# Patient Record
Sex: Female | Born: 1935 | Race: White | Hispanic: No | Smoking: Never smoker
Health system: Southern US, Community
[De-identification: ages and names within clinical notes are randomized; demographics above are authoritative.]

## PROBLEM LIST (undated history)

## (undated) DIAGNOSIS — H409 Unspecified glaucoma: Secondary | ICD-10-CM

## (undated) DIAGNOSIS — I10 Essential (primary) hypertension: Secondary | ICD-10-CM

## (undated) DIAGNOSIS — D649 Anemia, unspecified: Secondary | ICD-10-CM

## (undated) DIAGNOSIS — I471 Supraventricular tachycardia, unspecified: Secondary | ICD-10-CM

## (undated) DIAGNOSIS — Z9889 Other specified postprocedural states: Secondary | ICD-10-CM

## (undated) DIAGNOSIS — M1990 Unspecified osteoarthritis, unspecified site: Secondary | ICD-10-CM

## (undated) DIAGNOSIS — K219 Gastro-esophageal reflux disease without esophagitis: Secondary | ICD-10-CM

## (undated) DIAGNOSIS — R112 Nausea with vomiting, unspecified: Secondary | ICD-10-CM

## (undated) DIAGNOSIS — R32 Unspecified urinary incontinence: Secondary | ICD-10-CM

## (undated) HISTORY — DX: Unspecified glaucoma: H40.9

## (undated) HISTORY — PX: JOINT REPLACEMENT: SHX530

## (undated) HISTORY — DX: Supraventricular tachycardia: I47.1

## (undated) HISTORY — DX: Essential (primary) hypertension: I10

## (undated) HISTORY — DX: Supraventricular tachycardia, unspecified: I47.10

## (undated) HISTORY — PX: BREAST BIOPSY: SHX20

## (undated) HISTORY — PX: LAPAROSCOPIC CHOLECYSTECTOMY: SUR755

## (undated) HISTORY — PX: TOTAL KNEE ARTHROPLASTY: SHX125

---

## 1996-10-08 HISTORY — PX: INGUINAL HERNIA REPAIR: SUR1180

## 2006-06-24 ENCOUNTER — Emergency Department (HOSPITAL_COMMUNITY): Admission: EM | Admit: 2006-06-24 | Discharge: 2006-06-24 | Payer: Self-pay | Admitting: *Deleted

## 2006-07-09 ENCOUNTER — Encounter (HOSPITAL_COMMUNITY): Admission: RE | Admit: 2006-07-09 | Discharge: 2006-08-08 | Payer: Self-pay | Admitting: Preventative Medicine

## 2009-02-04 ENCOUNTER — Ambulatory Visit: Payer: Self-pay | Admitting: Cardiology

## 2009-03-09 ENCOUNTER — Ambulatory Visit (HOSPITAL_COMMUNITY): Admission: RE | Admit: 2009-03-09 | Discharge: 2009-03-09 | Payer: Self-pay | Admitting: Pulmonary Disease

## 2009-03-16 ENCOUNTER — Inpatient Hospital Stay (HOSPITAL_COMMUNITY): Admission: RE | Admit: 2009-03-16 | Discharge: 2009-03-20 | Payer: Self-pay | Admitting: Orthopedic Surgery

## 2009-03-25 DIAGNOSIS — E785 Hyperlipidemia, unspecified: Secondary | ICD-10-CM | POA: Insufficient documentation

## 2009-03-25 DIAGNOSIS — I10 Essential (primary) hypertension: Secondary | ICD-10-CM | POA: Insufficient documentation

## 2009-07-28 ENCOUNTER — Ambulatory Visit (HOSPITAL_COMMUNITY): Admission: RE | Admit: 2009-07-28 | Discharge: 2009-07-28 | Payer: Self-pay | Admitting: Ophthalmology

## 2009-08-18 ENCOUNTER — Ambulatory Visit (HOSPITAL_COMMUNITY): Admission: RE | Admit: 2009-08-18 | Discharge: 2009-08-18 | Payer: Self-pay | Admitting: Ophthalmology

## 2010-05-16 IMAGING — CR DG CHEST 2V
2 series · 2 of 2 positions shown · non-contrast
Comparison: None

CLINICAL DATA: History of hypertension.  Preoperative
cardiopulmonary evaluation.

CHEST - 2 VIEW

[view not recorded (1 of 2)]
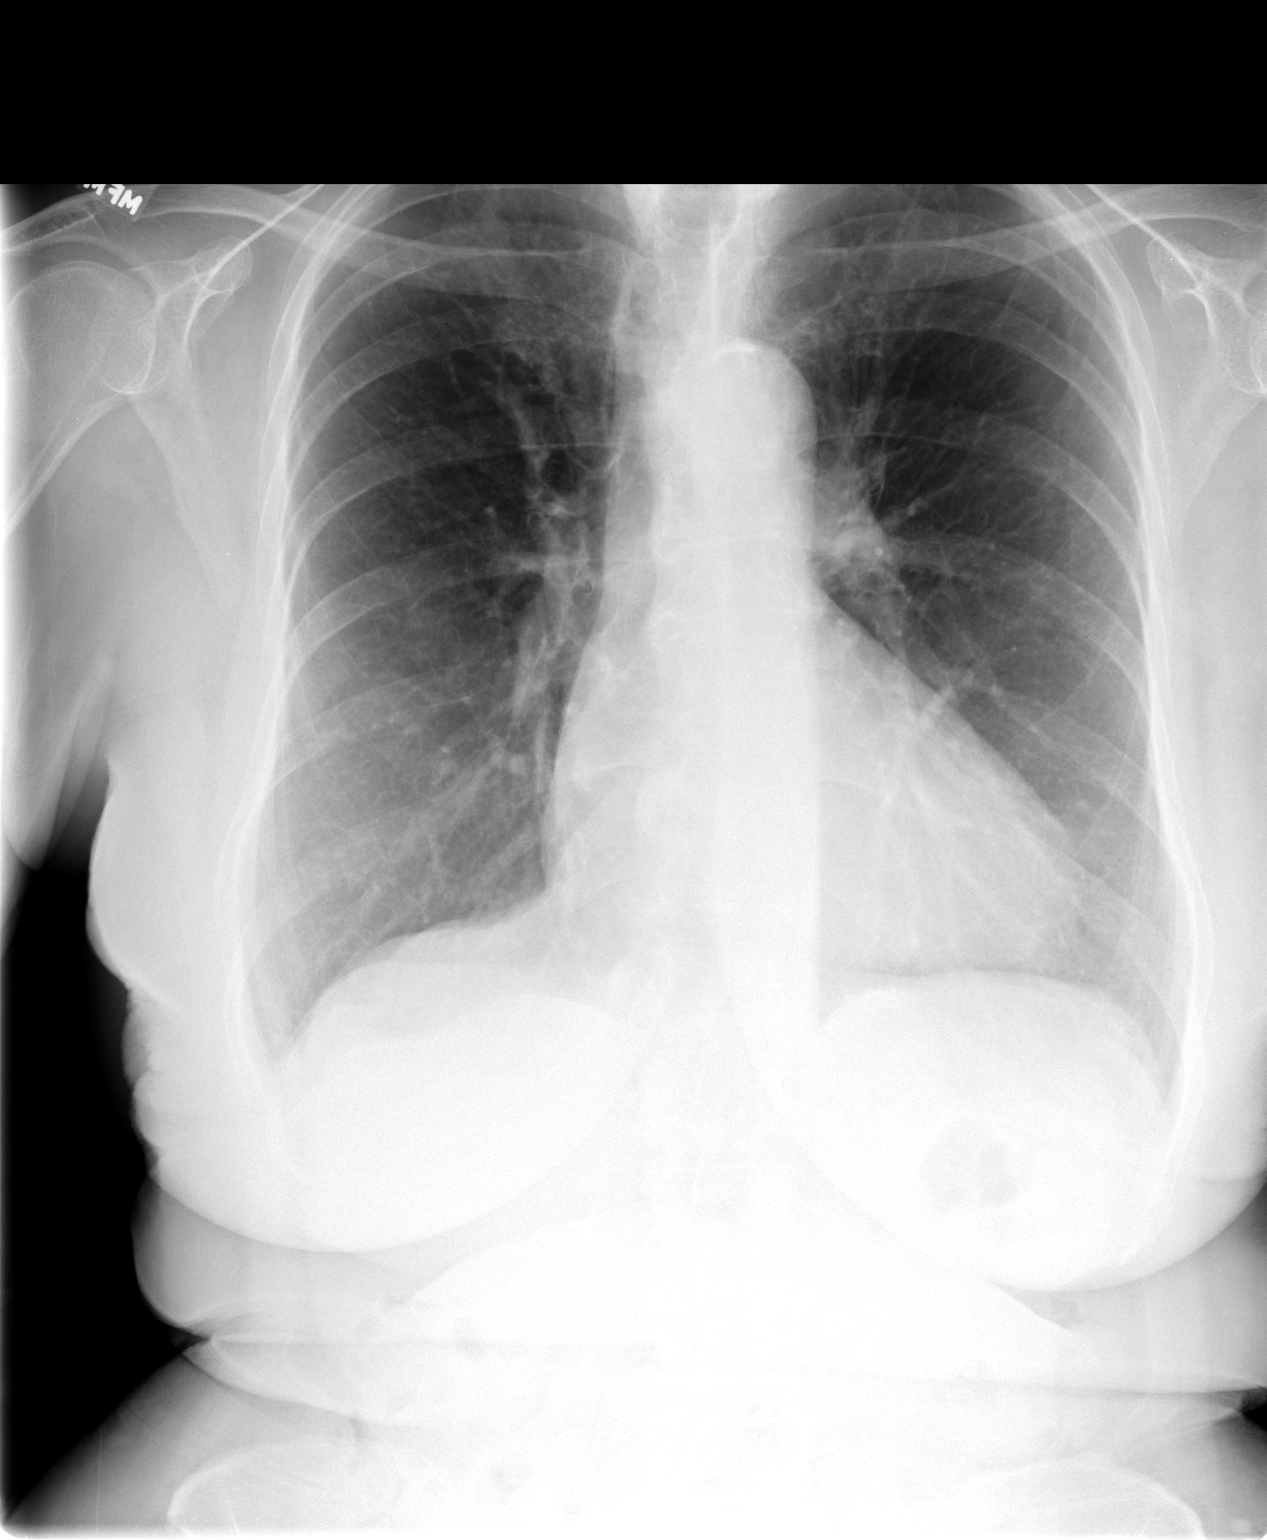

[view not recorded (2 of 2)]
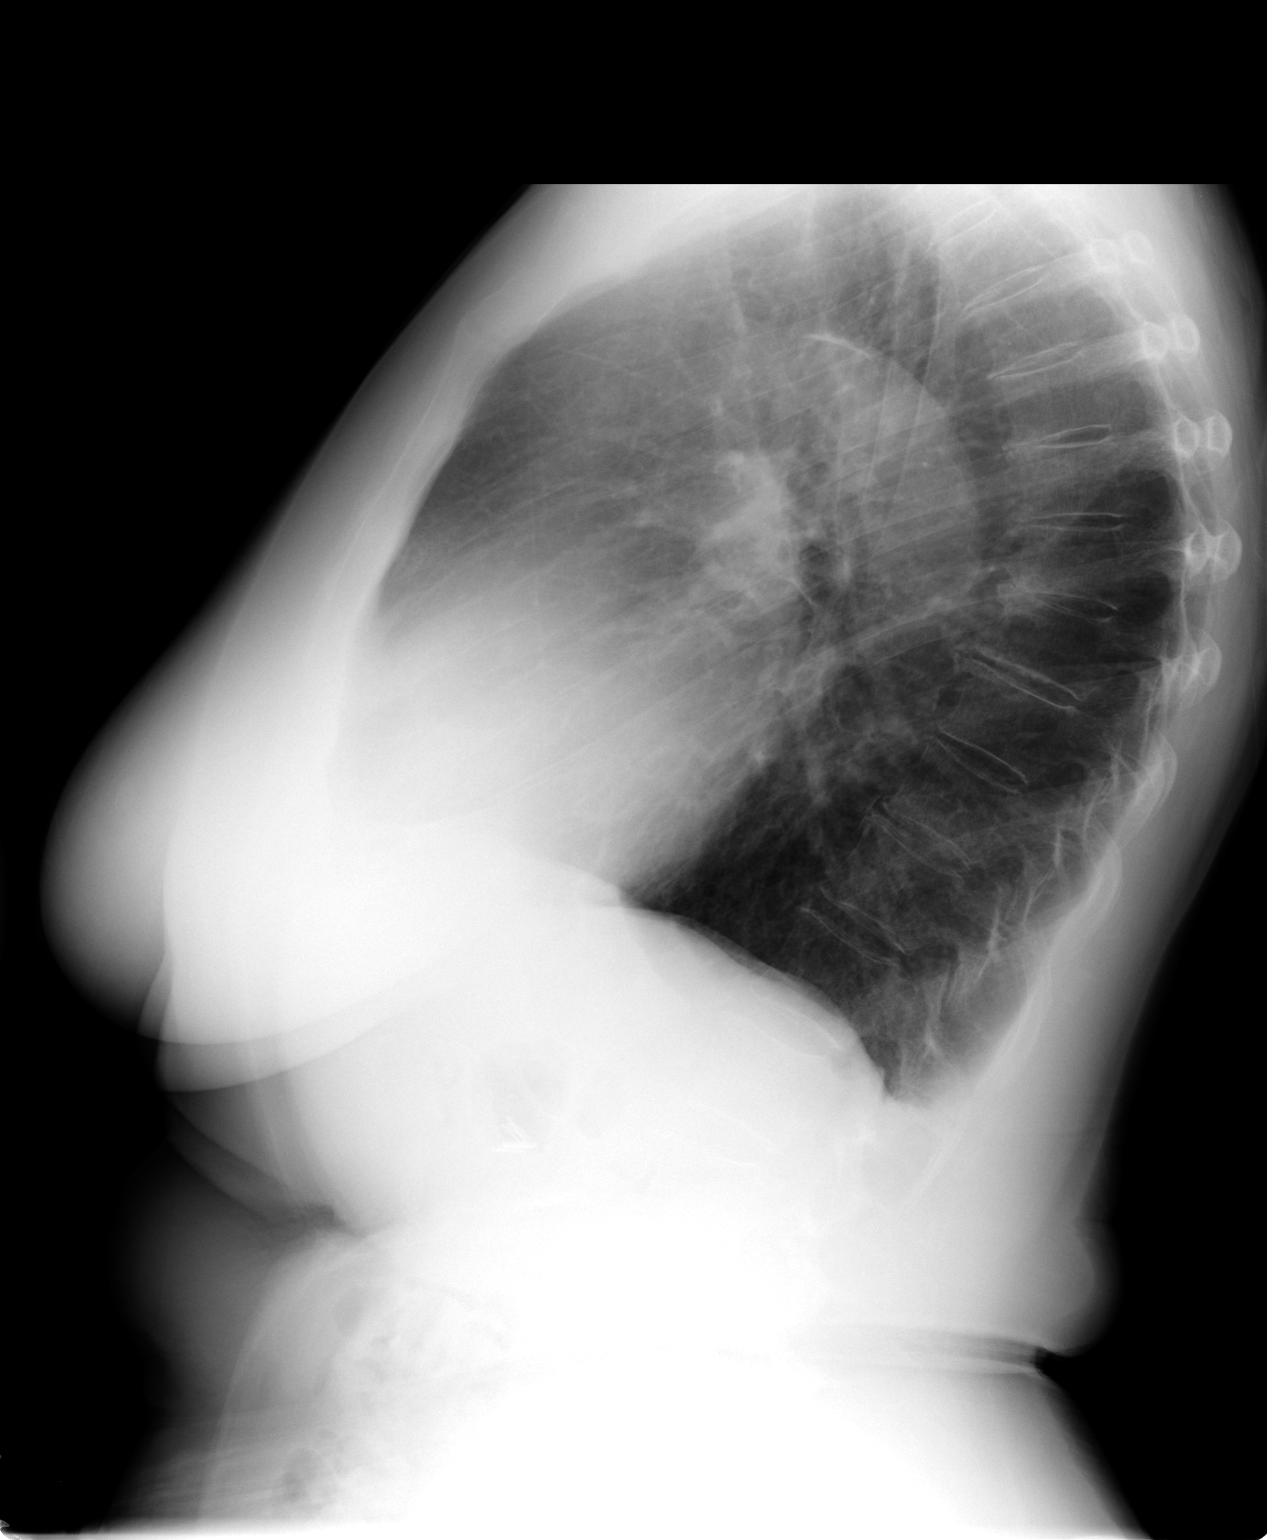

[2 of 2 positions shown; findings below may reference images not displayed]

FINDINGS: The cardiac silhouette is minimally enlarged. Ectasia and
nonaneurysmal calcification of the thoracic aorta is seen. The
lungs are well aerated and free of infiltrates. There is slight
flattening of the diaphragm on lateral image which may reflect
minimal hyperinflation configuration.  There is slight blunting of
posterior costophrenic angles most likely reflecting chronic
pleural thickening. There is a mildly osteopenic appearance of the
bones. There is mild degenerative spondylosis compatible with age.
IMPRESSION: Slight flattening of diaphragm on lateral image may reflect minimal
hyperinflation.  No acute cardiopulmonary process is identified.

## 2010-06-20 ENCOUNTER — Ambulatory Visit: Payer: Self-pay | Admitting: Cardiology

## 2010-06-20 ENCOUNTER — Encounter: Payer: Self-pay | Admitting: Adult Health

## 2010-06-21 ENCOUNTER — Encounter: Payer: Self-pay | Admitting: Cardiology

## 2010-06-26 ENCOUNTER — Ambulatory Visit (HOSPITAL_COMMUNITY): Admission: RE | Admit: 2010-06-26 | Discharge: 2010-06-26 | Payer: Self-pay | Admitting: Orthopedic Surgery

## 2010-06-29 ENCOUNTER — Ambulatory Visit (HOSPITAL_COMMUNITY): Admission: RE | Admit: 2010-06-29 | Discharge: 2010-06-29 | Payer: Self-pay | Admitting: Cardiology

## 2010-06-29 ENCOUNTER — Ambulatory Visit: Payer: Self-pay | Admitting: Cardiology

## 2010-06-29 ENCOUNTER — Encounter: Payer: Self-pay | Admitting: Cardiology

## 2010-06-30 ENCOUNTER — Encounter: Payer: Self-pay | Admitting: Adult Health

## 2010-07-02 ENCOUNTER — Emergency Department (HOSPITAL_COMMUNITY): Admission: EM | Admit: 2010-07-02 | Discharge: 2010-07-02 | Payer: Self-pay | Admitting: Emergency Medicine

## 2010-07-28 ENCOUNTER — Inpatient Hospital Stay (HOSPITAL_COMMUNITY): Admission: RE | Admit: 2010-07-28 | Discharge: 2010-07-31 | Payer: Self-pay | Admitting: Orthopedic Surgery

## 2010-11-07 NOTE — Miscellaneous (Signed)
Summary: echo  Clinical Lists Changes  Observations: Added new observation of ECHOINTERP:   Study Conclusions    - Left ventricle: The cavity size was normal. Wall thickness was     normal. Systolic function was normal. The estimated ejection     fraction was in the range of 55% to 60%. Wall motion was normal;     there were no regional wall motion abnormalities. Doppler     parameters are consistent with abnormal left ventricular     relaxation (grade 1 diastolic dysfunction).   - Mitral valve: Mild to possibly moderate regurgitation.   - Left atrium: The atrium was moderately dilated.   - Right atrium: The atrium was mildly dilated.   - Tricuspid valve: Trivial regurgitation.   - Pericardium, extracardiac: There was no pericardial effusion. Prepared and Electronically Authenticated by    Nona Dell, MD   2011-09-22T17:23:05.790  (06/29/2010 13:50)      Echocardiogram  Procedure date:  06/29/2010  Findings:        Study Conclusions    - Left ventricle: The cavity size was normal. Wall thickness was     normal. Systolic function was normal. The estimated ejection     fraction was in the range of 55% to 60%. Wall motion was normal;     there were no regional wall motion abnormalities. Doppler     parameters are consistent with abnormal left ventricular     relaxation (grade 1 diastolic dysfunction).   - Mitral valve: Mild to possibly moderate regurgitation.   - Left atrium: The atrium was moderately dilated.   - Right atrium: The atrium was mildly dilated.   - Tricuspid valve: Trivial regurgitation.   - Pericardium, extracardiac: There was no pericardial effusion. Prepared and Electronically Authenticated by    Nona Dell, MD   2011-09-22T17:23:05.790

## 2010-11-07 NOTE — Assessment & Plan Note (Signed)
Summary: ROV SURGICAL CLEARENCE KNEE SURGERY   History of Present Illness: Kathy Ford is a very pleasant 77 y/o CF who is here for pre-operative evaluation for L knee replacement. She has a history of hypertension, hypercholesterolemia.   She has been seen by Dr. Dietrich Pates over 2 years ago with complaints of palpations found to be elicited by caffine use. She has had no futher complaints of this since decreasing caffine intake.  She has been feeling overall weakness and muscle heaviness in arms and legs for the last month.  She was taken off of crestor by Dr. Juanetta Gosling, believing this to be related to statin induced myopathy.  Since stopping the crestor the symptoms have improved but she still has episodes of unexplained overall weakness at times.  She denies chest pain, DOE, syncope or diaphoresis.  Current Medications (verified): 1)  Klor-Con M20 20 Meq Cr-Tabs (Potassium Chloride Crys Cr) .Marland Kitchen.. 1` By Mouth Three Times A Day For Leg Cramps` 2)  Aspirin 81 Mg  Tabs (Aspirin) .Marland Kitchen.. 1 By Mouth Daily 3)  Lisinopril-Hydrochlorothiazide 10-12.5 Mg Tabs (Lisinopril-Hydrochlorothiazide) .Marland Kitchen.. 1 By Mouth Daily 4)  Bisoprolol-Hydrochlorothiazide 10-6.25 Mg Tabs (Bisoprolol-Hydrochlorothiazide) .Marland Kitchen.. 1 By Mouth Daily 5)  Alphagan P 0.15 % Soln (Brimonidine Tartrate) .... As Directed 6)  Xalatan 0.005 % Soln (Latanoprost) .... As Directed 7)  Multivitamins   Tabs (Multiple Vitamin) .Marland Kitchen.. 1 By Mouth Daily  Allergies (verified): 1)  ! Asa 2)  ! * "caine" Class  Past History:  Past medical, surgical, family and social histories (including risk factors) reviewed, and no changes noted (except as noted below).  Past Medical History: Reviewed history from 03/25/2009 and no changes required. HYPERTENSION, UNSPECIFIED (ICD-401.9) HYPERLIPIDEMIA-MIXED (ICD-272.4)    Past Surgical History: Reviewed history from 03/25/2009 and no changes required. Breast biopsies x2.  Laparoscopic gallbladder surgery, 1993.    Inguinal hernia repair, 1998.  Family History: Reviewed history from 03/25/2009 and no changes required. Family History of Coronary Artery Disease:  COPD Family History of CVA or Stroke:   Social History: Reviewed history from 03/25/2009 and no changes required. Retired - [employer] Married  Tobacco Use - No.  Alcohol Use - no Regular Exercise - yes Drug Use - no  Review of Systems       Gerneralized fatigue and L knee pain.  Vital Signs:  Patient profile:   77 year old female Height:      62 inches Weight:      147 pounds BMI:     26.98 Pulse rate:   63 / minute Resp:     16 per minute BP sitting:   154 / 97  (left arm)  Vitals Entered By: Marrion Coy, CNA (June 20, 2010 2:04 PM)  Physical Exam  General:  Well developed, well nourished, in no acute distress. Head:  normocephalic and atraumatic Eyes:  PERRLA/EOM intact; conjunctiva and lids normal. Lungs:  Clear bilaterally to auscultation and percussion. Heart:  Non-displaced PMI, chest non-tender; regular rate and rhythm, S1, S2 without murmurs, rubs or gallops. Carotid upstroke normal, no bruit. Normal abdominal aortic size, no bruits. Femorals normal pulses, no bruits. Pedals normal pulses. No edema, no varicosities. Msk:  joint tenderness.  Right knee. Pulses:  pulses normal in all 4 extremities Extremities:  No clubbing or cyanosis. Neurologic:  Alert and oriented x 3. Psych:  Normal affect.   EKG  Procedure date:  06/20/2010  Findings:      Normal sinus rhythm with rate of:63bpm  Sinus bradycardia  Impression & Recommendations:  Problem # 1:  PRE-OPERATIVE CARDIOVASCULAR EXAMINATION (ICD-V72.81) We will plan echocardiogram for LV function to assist with fluid status perioperatively.  Overall she is a low risk for knee surgery with 2 risk factors-HTN and Hypercholesterolemia.  She has also been seen and examined by Dr. Daleen Squibb who agrees with plan.  Once echo completed and  results read we will be able to make recommendations for clearance Her updated medication list for this problem includes:    Aspirin 81 Mg Tabs (Aspirin) .Marland Kitchen... 1 by mouth daily    Lisinopril-hydrochlorothiazide 10-12.5 Mg Tabs (Lisinopril-hydrochlorothiazide) .Marland Kitchen... 1 by mouth daily    Bisoprolol-hydrochlorothiazide 10-6.25 Mg Tabs (Bisoprolol-hydrochlorothiazide) .Marland Kitchen... 1 by mouth daily  Problem # 2:  HYPERTENSION, UNSPECIFIED (ICD-401.9) Not well controlled at this time.  As this is the only BP we have seen, it may be white coat.  However she is on two medications for control.  One medication, the bisprolol may be partial cause for fatigue.  Can consider just increasing the ACE inhibitor to 20 mg daily and discontinue BB to see if symptoms of fatigue improve. Will discuss at later date on follow-up. Her updated medication list for this problem includes:    Aspirin 81 Mg Tabs (Aspirin) .Marland Kitchen... 1 by mouth daily    Lisinopril-hydrochlorothiazide 10-12.5 Mg Tabs (Lisinopril-hydrochlorothiazide) .Marland Kitchen... 1 by mouth daily    Bisoprolol-hydrochlorothiazide 10-6.25 Mg Tabs (Bisoprolol-hydrochlorothiazide) .Marland Kitchen... 1 by mouth daily  Orders: 2-D Echocardiogram (2D Echo)  Patient Instructions: 1)  Your physician recommends that you schedule a follow-up appointment in: as needed 2)  Your physician has requested that you have an echocardiogram.  Echocardiography is a painless test that uses sound waves to create images of your heart. It provides your doctor with information about the size and shape of your heart and how well your heart's chambers and valves are working.  This procedure takes approximately one hour. There are no restrictions for this procedure.

## 2010-12-20 LAB — BASIC METABOLIC PANEL
BUN: 22 mg/dL (ref 6–23)
CO2: 29 mEq/L (ref 19–32)
CO2: 30 mEq/L (ref 19–32)
Chloride: 100 mEq/L (ref 96–112)
Chloride: 106 mEq/L (ref 96–112)
GFR calc Af Amer: >60 mL/min (ref >60)
GFR calc Af Amer: >60 mL/min (ref >60)
Potassium: 4 mEq/L (ref 3.5–5.1)
Sodium: 136 mEq/L (ref 135–145)

## 2010-12-20 LAB — CBC
HCT: 25 % — ABNORMAL LOW (ref 36.0–46.0)
HCT: 26.4 % — ABNORMAL LOW (ref 36.0–46.0)
Hemoglobin: 8.6 g/dL — ABNORMAL LOW (ref 12.0–15.0)
Hemoglobin: 8.6 g/dL — ABNORMAL LOW (ref 12.0–15.0)
MCH: 33.5 pg (ref 26.0–34.0)
MCH: 34 pg (ref 26.0–34.0)
MCV: 97.2 fL (ref 78.0–100.0)
MCV: 97.7 fL (ref 78.0–100.0)
MCV: 97.7 fL (ref 78.0–100.0)
RBC: 2.52 MIL/uL — ABNORMAL LOW (ref 3.87–5.11)
RBC: 2.56 MIL/uL — ABNORMAL LOW (ref 3.87–5.11)
RBC: 2.7 MIL/uL — ABNORMAL LOW (ref 3.87–5.11)
WBC: 6.2 10*3/uL (ref 4.0–10.5)

## 2010-12-20 LAB — TYPE AND SCREEN

## 2010-12-20 LAB — PROTIME-INR: Prothrombin Time: 16.7 seconds — ABNORMAL HIGH (ref 11.6–15.2)

## 2010-12-21 LAB — URINALYSIS, ROUTINE W REFLEX MICROSCOPIC
Bilirubin Urine: NEGATIVE
Bilirubin Urine: NEGATIVE
Glucose, UA: NEGATIVE mg/dL
Glucose, UA: NEGATIVE mg/dL
Hgb urine dipstick: NEGATIVE
Ketones, ur: NEGATIVE mg/dL
Nitrite: NEGATIVE
Specific Gravity, Urine: 1.007 (ref 1.005–1.030)
Specific Gravity, Urine: 1.02 (ref 1.005–1.030)
Urobilinogen, UA: 0.2 mg/dL (ref 0.0–1.0)
Urobilinogen, UA: 0.2 mg/dL (ref 0.0–1.0)
pH: 6.5 (ref 5.0–8.0)

## 2010-12-21 LAB — COMPREHENSIVE METABOLIC PANEL
AST: 20 U/L (ref 0–37)
AST: 22 U/L (ref 0–37)
Albumin: 3.8 g/dL (ref 3.5–5.2)
BUN: 16 mg/dL (ref 6–23)
CO2: 28 mEq/L (ref 19–32)
CO2: 30 mEq/L (ref 19–32)
Calcium: 9.4 mg/dL (ref 8.4–10.5)
Chloride: 104 mEq/L (ref 96–112)
Creatinine, Ser: 0.58 mg/dL (ref 0.4–1.2)
Creatinine, Ser: 0.71 mg/dL (ref 0.4–1.2)
GFR calc Af Amer: >60 mL/min (ref >60)
GFR calc Af Amer: >60 mL/min (ref >60)
GFR calc non Af Amer: >60 mL/min (ref >60)
GFR calc non Af Amer: >60 mL/min (ref >60)
Total Bilirubin: 0.4 mg/dL (ref 0.3–1.2)

## 2010-12-21 LAB — DIFFERENTIAL
Basophils Relative: 1 % (ref 0–1)
Eosinophils Absolute: 0.1 10*3/uL (ref 0.0–0.7)
Monocytes Absolute: 0.4 10*3/uL (ref 0.1–1.0)
Monocytes Relative: 7 % (ref 3–12)
Neutro Abs: 3.9 10*3/uL (ref 1.7–7.7)

## 2010-12-21 LAB — CBC
HCT: 33.2 % — ABNORMAL LOW (ref 36.0–46.0)
Hemoglobin: 11.3 g/dL — ABNORMAL LOW (ref 12.0–15.0)
Hemoglobin: 11.7 g/dL — ABNORMAL LOW (ref 12.0–15.0)
MCH: 33.4 pg (ref 26.0–34.0)
MCH: 33.5 pg (ref 26.0–34.0)
MCH: 33.8 pg (ref 26.0–34.0)
MCHC: 34.1 g/dL (ref 30.0–36.0)
MCHC: 34.5 g/dL (ref 30.0–36.0)
MCV: 97.8 fL (ref 78.0–100.0)
MCV: 98.3 fL (ref 78.0–100.0)
Platelets: 234 10*3/uL (ref 150–400)
RBC: 3.5 MIL/uL — ABNORMAL LOW (ref 3.87–5.11)

## 2010-12-21 LAB — SURGICAL PCR SCREEN
MRSA, PCR: NEGATIVE
Staphylococcus aureus: NEGATIVE

## 2010-12-21 LAB — BASIC METABOLIC PANEL
CO2: 27 mEq/L (ref 19–32)
Glucose, Bld: 104 mg/dL — ABNORMAL HIGH (ref 70–99)
Potassium: 3.1 mEq/L — ABNORMAL LOW (ref 3.5–5.1)
Sodium: 138 mEq/L (ref 135–145)

## 2010-12-21 LAB — URINE MICROSCOPIC-ADD ON

## 2010-12-21 LAB — APTT: aPTT: 26 seconds (ref 24–37)

## 2011-01-11 LAB — BASIC METABOLIC PANEL
CO2: 31 mEq/L (ref 19–32)
Calcium: 9.3 mg/dL (ref 8.4–10.5)
GFR calc Af Amer: >60 mL/min (ref >60)
GFR calc non Af Amer: >60 mL/min (ref >60)
Sodium: 140 mEq/L (ref 135–145)

## 2011-01-11 LAB — HEMOGLOBIN AND HEMATOCRIT, BLOOD
HCT: 32.8 % — ABNORMAL LOW (ref 36.0–46.0)
Hemoglobin: 11.4 g/dL — ABNORMAL LOW (ref 12.0–15.0)

## 2011-01-15 LAB — URINALYSIS, ROUTINE W REFLEX MICROSCOPIC
Bilirubin Urine: NEGATIVE
Nitrite: NEGATIVE
Specific Gravity, Urine: 1.005 (ref 1.005–1.030)
Urobilinogen, UA: 0.2 mg/dL (ref 0.0–1.0)
pH: 6 (ref 5.0–8.0)

## 2011-01-15 LAB — CBC
HCT: 27.7 % — ABNORMAL LOW (ref 36.0–46.0)
Hemoglobin: 7.4 g/dL — CL (ref 12.0–15.0)
MCHC: 34.3 g/dL (ref 30.0–36.0)
MCHC: 34.5 g/dL (ref 30.0–36.0)
MCV: 95.3 fL (ref 78.0–100.0)
MCV: 96.2 fL (ref 78.0–100.0)
Platelets: 144 10*3/uL — ABNORMAL LOW (ref 150–400)
Platelets: 187 10*3/uL (ref 150–400)
Platelets: 262 10*3/uL (ref 150–400)
RBC: 3.21 MIL/uL — ABNORMAL LOW (ref 3.87–5.11)
RDW: 12.6 % (ref 11.5–15.5)
RDW: 12.7 % (ref 11.5–15.5)
RDW: 13 % (ref 11.5–15.5)
WBC: 7.2 10*3/uL (ref 4.0–10.5)
WBC: 9.8 10*3/uL (ref 4.0–10.5)

## 2011-01-15 LAB — ABO/RH: ABO/RH(D): O POS

## 2011-01-15 LAB — BASIC METABOLIC PANEL
BUN: 15 mg/dL (ref 6–23)
BUN: 9 mg/dL (ref 6–23)
CO2: 30 mEq/L (ref 19–32)
CO2: 31 mEq/L (ref 19–32)
Calcium: 8.3 mg/dL — ABNORMAL LOW (ref 8.4–10.5)
Chloride: 101 mEq/L (ref 96–112)
Chloride: 98 mEq/L (ref 96–112)
Creatinine, Ser: 0.45 mg/dL (ref 0.4–1.2)
Creatinine, Ser: 0.61 mg/dL (ref 0.4–1.2)
Creatinine, Ser: 0.64 mg/dL (ref 0.4–1.2)
GFR calc Af Amer: >60 mL/min (ref >60)
GFR calc Af Amer: >60 mL/min (ref >60)
GFR calc non Af Amer: >60 mL/min (ref >60)
GFR calc non Af Amer: >60 mL/min (ref >60)
Glucose, Bld: 111 mg/dL — ABNORMAL HIGH (ref 70–99)
Glucose, Bld: 112 mg/dL — ABNORMAL HIGH (ref 70–99)
Glucose, Bld: 118 mg/dL — ABNORMAL HIGH (ref 70–99)
Glucose, Bld: 183 mg/dL — ABNORMAL HIGH (ref 70–99)
Potassium: 3.4 mEq/L — ABNORMAL LOW (ref 3.5–5.1)
Potassium: 4.4 mEq/L (ref 3.5–5.1)
Sodium: 138 mEq/L (ref 135–145)

## 2011-01-15 LAB — PROTIME-INR
INR: 1.5 (ref 0.00–1.49)
INR: 1.7 — ABNORMAL HIGH (ref 0.00–1.49)
Prothrombin Time: 15.5 seconds — ABNORMAL HIGH (ref 11.6–15.2)
Prothrombin Time: 18.4 seconds — ABNORMAL HIGH (ref 11.6–15.2)
Prothrombin Time: 19.5 seconds — ABNORMAL HIGH (ref 11.6–15.2)

## 2011-01-15 LAB — COMPREHENSIVE METABOLIC PANEL
AST: 19 U/L (ref 0–37)
Albumin: 3.8 g/dL (ref 3.5–5.2)
Alkaline Phosphatase: 54 U/L (ref 39–117)
Chloride: 106 mEq/L (ref 96–112)
GFR calc Af Amer: >60 mL/min (ref >60)
Potassium: 4.4 mEq/L (ref 3.5–5.1)
Sodium: 144 mEq/L (ref 135–145)
Total Bilirubin: 0.4 mg/dL (ref 0.3–1.2)
Total Protein: 7.7 g/dL (ref 6.0–8.3)

## 2011-01-15 LAB — TYPE AND SCREEN: Antibody Screen: NEGATIVE

## 2011-01-15 LAB — URINE MICROSCOPIC-ADD ON

## 2011-01-15 LAB — HEMOGLOBIN AND HEMATOCRIT, BLOOD
HCT: 30.8 % — ABNORMAL LOW (ref 36.0–46.0)
Hemoglobin: 10.6 g/dL — ABNORMAL LOW (ref 12.0–15.0)

## 2011-02-20 NOTE — Letter (Signed)
February 04, 2009    Edward L. Juanetta Gosling, MD  [address]  [place], [place] [address]   RE:  Kathy Ford, Kathy Ford  MRN:  [id number]  /  DOB:  [birth year]   Dear Kathy Ford,   It is my pleasure evaluating Kathy Ford in the office today in  consultation at your request.  As you know, this nice woman requires  right total knee replacement in the near future and probably subsequent  left total knee arthroplasty as well.  She has no known cardiovascular  disease, although she does have risk factors including hyperlipidemia  and hypertension.  She has never previously been evaluated by  cardiologist nor undergone any significant cardiac testing.  She does  have some chronic chest discomfort that is mild and infrequent.  She  occasionally notes retrosternal discomfort that responds to drinking  water and which she attributes to a GI etiology.  She has been fairly  active until her arthritic problems slowed her down.  She did not have  any exertion related symptoms.   PRIOR SURGICAL PROCEDURES:  Herniorrhaphy in 1998, a laparoscopic  cholecystectomy in 1999, and excisional breast biopsy in 1985 and in  1989.  She had a remote tonsillectomy.   CURRENT MEDICATIONS:  1. KCl 20 mEq t.i.d. for leg cramps.  2. Aspirin 81 mg daily.  3. Rosuvastatin 10 mg daily.  4. Lisinopril/hydrochlorothiazide 10/12.5 mg daily.  5. Bisoprolol/hydrochlorothiazide 1 daily.  6. A number of nutraceuticals.   She has no known allergies to medications.   SOCIAL HISTORY:  Retired; married and lives locally - her husband is a  patient of our [employer] risk [employer].  She does not use tobacco products nor alcohol.   FAMILY HISTORY:  Father died at age 40 due to circulatory problems.  Mother lived to age 37 and had COPD.   More notable is her children's history.  Her daughter suffered a CVA at  age 87 and myocardial infarction at age 74 eventually requiring  placement of an AICD.  Her son had a myocardial infarction at age 12 and  subsequently  required a pacemaker.   REVIEW OF SYSTEMS:  Noted for GERD symptoms.  She has visual impairment  and wears contacts.  She has had cataract extractions bilaterally.  She  has glaucoma.  She occasionally notes some pedal edema.  All other  systems reviewed and are negative.   PHYSICAL EXAMINATION:  GENERAL:  Pleasant woman in no acute distress.  VITAL SIGNS:  The weight is 145 pounds, blood pressure 140/70, heart  rate 65 and regular, respirations 12 and unlabored.  HEENT:  Anicteric sclerae; normal lids and conjunctivae; normal oral  mucosa.  NECK:  No jugular venous distention; normal carotid upstrokes without  bruits.  ENDOCRINE:  No thyromegaly.  HEMATOPOIETIC:  No adenopathy.  SKIN:  No significant lesions.  PSYCHIATRIC:  Alert and oriented.  RESPIRATORY:  Mild kyphosis; clear lung fields.  CARDIAC:  Normal first and second heart sounds; prominent fourth heart  sound; modest systolic ejection murmur.  ABDOMEN:  Soft and nontender; normal bowel sounds; no organomegaly.  EXTREMITIES:  Distal pulses intact; trace edema.  NEUROLOGIC:  Symmetric strength and tone; normal cranial nerves.   EKG:  Normal sinus rhythm; left atrial abnormality; nondiagnostic  inferior Q-wave; shallow T-wave inversion limited to lead V3, which is  new when compared to previous tracing performed May 20, 1997,  tracing.   IMPRESSION:  Kathy Ford is a remarkable 77 year old woman with a  marvelous attitude, who has enjoyed  generally excellent health.  Her  family history is impressive, but only in the generation following her,  not in her siblings or parents.  Of note, she has a markedly positive  family history for vascular disease on her husband's side.  I do not  think that the health problems of her children in gender adequate  concern to proceed with any testing.  Her risk of an adverse outcome  during knee surgery is probably well less than the average for women her  age.  Please let me know if  she requires cardiology attention in the  future.  [facility] will be happy to see this nice woman if  required when she is admitted for her orthopedic surgery.    Sincerely,      Gerrit Friends. Dietrich Pates, MD, [facility]  Electronically Signed    RMR/MedQ  DD: 02/04/2009  DT: 02/05/2009  Job #: [id number]   CC:    Mila Homer. Sherlean Foot, M.D.

## 2011-02-20 NOTE — Op Note (Signed)
Kathy Ford, Kathy Ford              ACCOUNT NO.:  [id number]   MEDICAL RECORD NO.:  [id number]          PATIENT TYPE:  INP   LOCATION:  1610                         FACILITY:  [facility]   PHYSICIAN:  Ollen Gross, M.D.    DATE OF BIRTH:  September 23, 1929   DATE OF PROCEDURE:  03/16/2009  DATE OF DISCHARGE:                               OPERATIVE REPORT   PREOPERATIVE DIAGNOSIS:  Osteoarthritis right knee.   POSTOPERATIVE DIAGNOSIS:  Osteoarthritis right knee.   PROCEDURE:  Right total knee arthroplasty.   SURGEON:  Ollen Gross, M.D.   ASSISTANT:  Avel Peace PA-C   ANESTHESIA:  Spinal.   ESTIMATED BLOOD LOSS:  Minimal.   DRAINS:  None.   TOURNIQUET TIME:  28 minutes at 300 mmHg.   COMPLICATIONS:  None.   CONDITION:  Stable to recovery.   CLINICAL HISTORY:  Kathy Ford Is a 77 year old female with end-stage  arthritis of the right knee with progressively worsening pain and  dysfunction.  She has failed nonoperative management and presents now  for total knee arthroplasty.   PROCEDURE IN DETAIL:  After successful administration of spinal  anesthetic, a tourniquet placed on the right thigh and right lower  extremity prepped and draped in usual sterile fashion.  Extremity was  wrapped in Esmarch, knee flexed, tourniquet inflated to 300 mmHg.  Midline incision was made with 10 blade through subcutaneous tissue to  the level of the extensor mechanism.  A fresh blade is used make a  medial parapatellar arthrotomy.  Soft tissue on the proximal and medial  tibia is subperiosteally elevated to the joint line with the knife and  into the semimembranosus bursa with a Cobb elevator.  Soft tissue  laterally is elevated with attention being paid to avoiding patellar  tendon on tibial tubercle.  Patella subluxed laterally, knee flexed 90  degrees and ACL and PCL removed.  Drill was used to create a starting  hole in the distal femur and canal was thoroughly irrigated.  The 5  degree right  valgus alignment guide is placed and referencing off  posterior condyles, rotation is marked and the block is pinned to remove  10 mm off the distal femur.  Distal femoral resection is made with an  oscillating saw.  Sizing blocks placed, size 2.5 is most appropriate.  Rotations marked at the epicondylar axis and a size 2.5 cutting block  was placed.  The anterior, posterior and chamfer cuts are made.   Tibia subluxed forward and the menisci are removed.  The extramedullary  tibial alignment guide is placed referencing proximally at the medial  aspect of the tibial tubercle and distally along the second metatarsal  axis and tibial crest.  Blocks pinned to remove 10 mm of the non  deficient lateral side.  Tibial resection is made with an oscillating  saw.  Size 2.5 is the most appropriate tibial component and the proximal  tibia is prepared with the modular drill and keel punch for the size  2.5.  Femoral preparation is completed with the intercondylar cut.   Size 2.5 mobile bearing tibial trial 2.5 posterior stabilized  femoral  trial and a 10-mm posterior stabilized rotating platform insert trial  were placed.  With the 10 there is a tiny bit of hyperextension so I  went to 12.5 which allowed for full extension with excellent varus-  valgus and anterior-posterior balance throughout full range of motion.  Patella was everted and thickness measured to be 21 mm.  Freehand  resection taken to 12 mm, 38 template is placed, lug holes were drilled,  trial patella was placed and it tracks normally.  Osteophytes removed  off the posterior femur with the trial in place.  All trials were  removed and the cut bone surfaces prepared with pulsatile lavage.  Cement was mixed and once ready for implantation, the size 2.5 mobile  bearing tibial tray, 2.5 posterior stabilized femur and 38 patella were  cemented into place.  The patella was held with a clamp.  Trial 12.5-mm  insert is placed, knee held in  full extension and all extruded cement  removed.  When the cement is fully hardened then the permanent size 2.5  10 mm posterior stabilized rotating platform insert is placed in the  tibial tray.  Wound is copiously irrigated with saline solution.  FloSeal injected on the posterior capsule in medial and lateral gutters  in the suprapatellar area.  Moist sponge is placed and tourniquet  released for total time of 28 minutes.  The sponge is held for 2 minutes  then removed.  Minimal bleeding was encountered.  The bleeding that is  encountered stopped with electrocautery.  The joint was then thoroughly  irrigated with saline solution and the arthrotomy closed with  interrupted #1 PDS.  Flexion against gravity is about 140 degrees.  Subcu was closed with interrupted 2-0 Vicryl and subcuticular running 4-  0 Monocryl.  The incision is then cleaned and dried and Steri-Strips and  a bulky sterile dressing applied.  She is then placed into a knee  immobilizer, awakened and transferred to recovery in stable condition.      Ollen Gross, M.D.  Electronically Signed     FA/MEDQ  D:  03/16/2009  T:  03/17/2009  Job:  [id number]

## 2011-02-20 NOTE — H&P (Signed)
Kathy Ford, Kathy Ford              ACCOUNT NO.:  [id number]   MEDICAL RECORD NO.:  [id number]          PATIENT TYPE:  INP   LOCATION:  NA                           [facility]ACILITY:  [facility]   PHYSICIAN:  Ollen Gross, M.D.    DATE OF BIRTH:  [birth year]   DATE OF ADMISSION:  03/16/2009  DATE OF DISCHARGE:                              HISTORY & PHYSICAL   CHIEF COMPLAINT:  Right greater than left knee pain.   HISTORY OF PRESENT ILLNESS:  Patient is a 77 year old female who has  been seen by Dr. Lequita Halt for ongoing bilateral knee pain.  She has been  treated conservatively in the past also including injections.  She is at  a point now where she continues to have pain.  It is felt she would  benefit from undergoing surgical intervention.  Risks and benefits have  been discussed.  She has been seen preop by Dr. Dietrich Pates and felt to be  stable for surgery.   ALLERGIES:  1. ASPIRIN.  2. THE PRESERVATIVES IN THE CAINE MEDICATIONS SUCH AS LIDOCAINE.      Please note she did have a note sent over from her dentist and they      used local Septocaine with epinephrine in [facility] without      complications.   CURRENT MEDICATIONS:  1. Potassium.  2. Bisoprolol.  3. Lisinopril.  4. Crestor.  5. Aciphex.  6. Xalatan.  7. Aspirin.  8. Vitamin.  9. CoQ10.  10.Glucosamine.  11.Tylenol.  12.Caltrate.  13.Visine Eye.  14.Lotrimin Ultra cream.   PAST MEDICAL HISTORY:  1. Chronic dry eyes.  2. Glaucoma.  3. Cataracts.  4. Hypertension.  5. Hypercholesterolemia.  6. Reflux disease.  7. Urinary incontinence.  8. Postmenopausal.  9. History of scarlet fever.  10.History of mumps.   PAST SURGICAL HISTORY:  1. Breast biopsies x2.  2. Laparoscopic gallbladder surgery, 1993.  3. Inguinal hernia repair, 1998.   FAMILY HISTORY:  Father with poor circulation and heart disease.  Mother with COPD.  Son had a heart attack.  Daughter with a stroke and  also a heart attack.  She has got a  defibrillator pacemaker.  She has  another daughter with hypertension and ankylosing spondylitis.   SOCIAL HISTORY:  Married.  Retired from [employer].  Nonsmoker.  No alcohol.  Three children.  She does have a caregiver  lined up.   REVIEW OF SYSTEMS:  GENERAL:  No fevers, chills, or night sweats.  NEURO:  No seizure, syncope, or paralysis.  RESPIRATORY:  No shortness  of breath, productive cough, or hemoptysis.  CARDIOVASCULAR:  No chest  pain, angina, or orthopnea.  GI:  No nausea, vomiting, diarrhea, or  constipation.  GU:  A little bit of incontinence.  No dysuria or  hematuria.  MUSCULOSKELETAL:  Knee pain.   PHYSICAL EXAM:  VITAL SIGNS:  Pulse 64.  Respirations 12.  Blood  pressure 182/88 in the right arm and 192/84 in the left arm.  GENERAL: A 77 year old white female, well nourished, well developed, no  acute distress.  She is alert, oriented,  cooperative, pleasant.  HEENT:  Normocephalic, atraumatic.  Pupils are round and reactive.  Oropharynx clear.  EOMs intact.  She does have 1 contact in the left  eye.  Uses glasses as a backup.  NECK:  Supple.  Faint right carotid bruit.  CHEST:  Clear anterior and posterior chest walls.  No rhonchi, rales, or  wheezing.  HEART:  Regular rate and rhythm.  No murmur.  ABDOMEN:  Soft, nontender.  Bowel sounds present.  RECTAL:  Not done, not pertinent to present illness.  BREASTS:  Not done, not pertinent to present illness.  GENITALIA:  Not done, not pertinent to present illness.  EXTREMITIES:  Right knee, no effusion, varus malalignment, deformity.  Range of motion 5 to 120.  No instability.  Left knee, no effusion.  Range of motion 0 to 125, slight crepitus.  No instability.   IMPRESSION:  Osteoarthritis, right knee greater than left knee.   PLAN:  Patient admitted to [facility] to undergo a right total  knee replacement arthroplasty.  Surgery will be performed by Dr. Ollen Gross.       Alexzandrew L. Perkins, P.A.C.      Ollen Gross, M.D.  Electronically Signed    ALP/MEDQ  D:  03/15/2009  T:  03/16/2009  Job:  [id number]   cc:   Ollen Gross, M.D.  Fax: [phone]   Oneal Deputy. Juanetta Gosling, M.D.  Fax: [phone]   Gerrit Friends. Dietrich Pates, MD, [facility]  [address]  [place], [place] [address]

## 2011-02-23 NOTE — Discharge Summary (Signed)
Kathy Ford, Kathy Ford              ACCOUNT NO.:  [id number]   MEDICAL RECORD NO.:  [id number]          PATIENT TYPE:  INP   LOCATION:  1610                         FACILITY:  [facility]   PHYSICIAN:  Ollen Gross, M.D.    DATE OF BIRTH:  04-10-1929   DATE OF ADMISSION:  03/16/2009  DATE OF DISCHARGE:  03/20/2009                               DISCHARGE SUMMARY   ADMITTING DIAGNOSES:  1. Osteoarthritis, right greater than left knee.  2. Chronic dry eyes.  3. Glaucoma.  4. Cataracts.  5. Hypertension.  6. Hypercholesterolemia.  7. Reflux disease.  8. Urinary incontinence.  9. Postmenopausal.  10.Past history of scarlet fever.  11.History of mumps.   DISCHARGE DIAGNOSES:  1. Osteoarthritis right knee, status post right total knee replacement      arthroplasty.  2. Osteoarthritis left knee.  3. Postoperative acute blood loss anemia.  4. Status post transfusion without sequelae.  5. Postoperative hyponatremia, improved.  6. Postoperative hypokalemia, improved.   Remaining discharge diagnoses same as admitting diagnoses.   PROCEDURE:  March 16, 2009 right total knee.   SURGEON:  Dr. Lequita Halt.   ASSISTANT:  Avel Peace, PA-C.   Spinal anesthesia.  Tourniquet time 28 minutes.   CONSULTS:  None.   BRIEF HISTORY:  Kathy Ford is a 77 year old female with end-stage  arthritis of the right knee, progressive worsening pain and dysfunction  __________ and now presents for total knee arthroplasty.   LABORATORY DATA:  Preoperative CBC showed hemoglobin 12, hematocrit of  36.2, white cell count 7.2, platelets 262.  Postoperative hemoglobin  9.6, dropped down to 7.4, given 2 units of blood, post transfusion  hemoglobin 10.5, back up to 10.6 with a crit of 30.8.  PT/PTT  preoperative 13.9 and 26 respectively, INR 1.0.  Serial ProTimes  followed per Coumadin protocol, last known PT/INR 18.4 and 1.5.  Chem  panel on admission all within normal limits.  Serial BMETs were  followed.  Sodium  did drop from 144 to 133 back up to 138, potassium  dropped from 4.4 to 3 back up to 3.4.  Remaining electrolytes remained  within normal limits.  Preop UA small hemoglobin, 3 - 6 red cells, rare  bacteria, otherwise negative.  Blood group type O+.   X-RAYS:  Chest x-ray March 10, 2009:  Slight flattening of diaphragm on  lateral image, may reflect minimal hyperinflation.  No acute  cardiopulmonary processes identified.   HOSPITAL COURSE:  The patient admitted to [facility], taken to  the OR, underwent above-stated procedure without complication.  The  patient tolerated procedure well, later transferred to the recovery room  on the orthopedic floor.  Started on PCA and p.o. analgesics, started  back on her home medications.  Hemoglobin was 9.6 postoperative,  pressure was okay, a little on the lower side so it was monitored.  Gave  fluids, probably due to her Duramorph and the PCA.  Started getting up  out of bed with therapy, by day two the dressing was changed.  Incision  was healing well.  Did feel a little lightheaded and hemoglobin was low  at 7.4 so she was symptomatic with the anemia.  Did give her 2 units of  blood, tolerated the blood well.  Once she was doing that started doing  a little bit better with therapy walking about 20 feet, and then later  up to 130 feet by the following day.  Hemoglobin was back up to 10.5,  potassium was low though on day three so we replaced the potassium.  Foley was removed, it was kept an extra day because of the fluid  monitoring with the low pressure and also with the blood.  Did receive  one more day of therapy and by postoperative day four the patient was  walking over 250 feet, doing well.  INR was not quite therapeutic so she  was given Lovenox and then discharged home.   DISCHARGE PLAN:  Discharged home on March 20, 2009.   DISCHARGE DIAGNOSES:  Please see above.   DISCHARGE MEDICATIONS:  Percocet, Robaxin, Coumadin and Lovenox  prior to  discharge.   DIET:  As tolerated.   ACTIVITY:  Total knee protocol.  Home health PT and home health nursing.  Weightbearing as tolerated.  Followup 2 weeks.   DISPOSITION:  Home.   CONDITION UPON DISCHARGE:  Improved.      Alexzandrew L. Perkins, P.A.C.      Ollen Gross, M.D.  Electronically Signed    ALP/MEDQ  D:  04/19/2009  T:  04/19/2009  Job:  [id number]   cc:   Ollen Gross, M.D.  Fax: [phone]   Oneal Deputy. Juanetta Gosling, M.D.  Fax: [phone]   Gerrit Friends. Dietrich Pates, MD, [facility]  [address]  [place], [place] [address]

## 2011-09-21 ENCOUNTER — Encounter: Payer: Self-pay | Admitting: Cardiology

## 2011-10-12 ENCOUNTER — Ambulatory Visit (HOSPITAL_COMMUNITY)
Admission: RE | Admit: 2011-10-12 | Discharge: 2011-10-12 | Disposition: A | Discharge status: Home / Self Care | Payer: Medicare Other | Source: Ambulatory Visit | Attending: Pulmonary Disease | Admitting: Pulmonary Disease

## 2011-10-12 ENCOUNTER — Other Ambulatory Visit (HOSPITAL_COMMUNITY): Payer: Self-pay | Admitting: Pulmonary Disease

## 2011-10-12 DIAGNOSIS — M47817 Spondylosis without myelopathy or radiculopathy, lumbosacral region: Secondary | ICD-10-CM | POA: Diagnosis not present

## 2011-10-12 DIAGNOSIS — M545 Low back pain, unspecified: Secondary | ICD-10-CM | POA: Diagnosis not present

## 2011-10-12 DIAGNOSIS — M549 Dorsalgia, unspecified: Secondary | ICD-10-CM

## 2011-12-03 DIAGNOSIS — I10 Essential (primary) hypertension: Secondary | ICD-10-CM | POA: Diagnosis not present

## 2011-12-03 DIAGNOSIS — M1990 Unspecified osteoarthritis, unspecified site: Secondary | ICD-10-CM | POA: Diagnosis not present

## 2011-12-03 DIAGNOSIS — E785 Hyperlipidemia, unspecified: Secondary | ICD-10-CM | POA: Diagnosis not present

## 2012-01-30 DIAGNOSIS — H4010X0 Unspecified open-angle glaucoma, stage unspecified: Secondary | ICD-10-CM | POA: Diagnosis not present

## 2012-01-30 DIAGNOSIS — Z961 Presence of intraocular lens: Secondary | ICD-10-CM | POA: Diagnosis not present

## 2012-01-30 DIAGNOSIS — H4011X0 Primary open-angle glaucoma, stage unspecified: Secondary | ICD-10-CM | POA: Diagnosis not present

## 2012-01-30 DIAGNOSIS — H40159 Residual stage of open-angle glaucoma, unspecified eye: Secondary | ICD-10-CM | POA: Diagnosis not present

## 2012-02-19 DIAGNOSIS — I10 Essential (primary) hypertension: Secondary | ICD-10-CM | POA: Diagnosis not present

## 2012-02-19 DIAGNOSIS — Z23 Encounter for immunization: Secondary | ICD-10-CM | POA: Diagnosis not present

## 2012-02-19 DIAGNOSIS — J329 Chronic sinusitis, unspecified: Secondary | ICD-10-CM | POA: Diagnosis not present

## 2012-03-17 DIAGNOSIS — K219 Gastro-esophageal reflux disease without esophagitis: Secondary | ICD-10-CM | POA: Diagnosis not present

## 2012-03-17 DIAGNOSIS — J392 Other diseases of pharynx: Secondary | ICD-10-CM | POA: Diagnosis not present

## 2012-03-17 DIAGNOSIS — J310 Chronic rhinitis: Secondary | ICD-10-CM | POA: Diagnosis not present

## 2012-03-19 ENCOUNTER — Encounter (INDEPENDENT_AMBULATORY_CARE_PROVIDER_SITE_OTHER): Payer: Self-pay | Admitting: *Deleted

## 2012-03-25 ENCOUNTER — Ambulatory Visit (INDEPENDENT_AMBULATORY_CARE_PROVIDER_SITE_OTHER): Payer: Medicare Other | Admitting: Internal Medicine

## 2012-03-25 ENCOUNTER — Encounter (INDEPENDENT_AMBULATORY_CARE_PROVIDER_SITE_OTHER): Payer: Self-pay | Admitting: Internal Medicine

## 2012-03-25 ENCOUNTER — Encounter (INDEPENDENT_AMBULATORY_CARE_PROVIDER_SITE_OTHER): Payer: Self-pay | Admitting: *Deleted

## 2012-03-25 ENCOUNTER — Other Ambulatory Visit (INDEPENDENT_AMBULATORY_CARE_PROVIDER_SITE_OTHER): Payer: Self-pay | Admitting: *Deleted

## 2012-03-25 VITALS — BP 142/68 | HR 60 | Temp 98.0°F | Ht 62.0 in | Wt 151.1 lb

## 2012-03-25 DIAGNOSIS — K219 Gastro-esophageal reflux disease without esophagitis: Secondary | ICD-10-CM | POA: Insufficient documentation

## 2012-03-25 NOTE — Patient Instructions (Addendum)
Continue Aciphex. Sleep with HOB elevated. GERD diet reviewed with patient. EGD.

## 2012-03-25 NOTE — Progress Notes (Signed)
Subjective:     Patient ID: Kathy Ford, female   DOB: [birth year], 78 y.o.   MRN: [id number]  HPI  Referred by Dr. Andrey Campanile for heart burn. Saw Dr. Andrey Campanile one week ago for a nasal discharge. Nasal discharge x 2 yrs.  . She has a hacky cough and dry mouth.  She continue to be hoarse. No dysphagia. Appetite is okay. No weight loss.  Aciphex controls acid reflux as well as her diet. 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  She saw Dr Juanetta Gosling for a nasal drop and put on Augmentin which helped.    Review of Systems see hpi Current Outpatient Prescriptions  Medication Sig Dispense Refill  . amLODipine (NORVASC) 5 MG tablet Take 5 mg by mouth daily.      Marland Kitchen aspirin 81 MG tablet Take 81 mg by mouth daily.        . bisoprolol-hydrochlorothiazide (ZIAC) 10-6.25 MG per tablet Take 1 tablet by mouth daily.        . brimonidine (ALPHAGAN) 0.15 % ophthalmic solution 1 drop as directed.        . latanoprost (XALATAN) 0.005 % ophthalmic solution 1 drop as directed.        Marland Kitchen lisinopril-hydrochlorothiazide (PRINZIDE,ZESTORETIC) 10-12.5 MG per tablet Take 1 tablet by mouth daily.        . Multiple Vitamin (MULTIVITAMIN) tablet Take 1 tablet by mouth daily.        . potassium chloride SA (K-DUR,KLOR-CON) 20 MEQ tablet Take 20 mEq by mouth 3 (three) times daily.        . RABEprazole (ACIPHEX) 20 MG tablet Take 20 mg by mouth daily.        Past Medical History  Diagnosis Date  . Hyperlipidemia   . Hypertension   . Glaucoma    Past Surgical History  Procedure Date  . Breast biopsy     x2  . Inguinal hernia repair 1998  . Laparoscopic cholecystectomy   . Total knee arthroplasty     x 2 (both) one in 2010 and 2011   Family Status  Relation Status Death Age  . Mother Deceased     COPD  . Father Deceased     CAD  . Sister Alive     good health  . Brother Alive     good health   History    Social History  . Marital Status: Married    Spouse Name: N/A    Number of Children: N/A  . Years of Education: N/A   Occupational History  . Retired - [employer]    Social History Main Topics  . Smoking status: Never Smoker   . Smokeless tobacco: Not on file  . Alcohol Use: No  . Drug Use: No  . Sexually Active: Not on file   Other Topics Concern  . Not on file   Social History Narrative   MarriedGets regular exercise   Allergies  Allergen Reactions  . Aspirin         Objective:   Physical Exam Filed Vitals:   03/25/12 1123  Height: 5\' 2"  (1.575 m)  Weight: 151 lb 1.6 oz (68.539 kg)        Assessment:    Probable GERD uncontrolled. Nasal drainage: doubt from acid reflux.    Plan:    EGD in the near future with Dr Karilyn Cota.The risks and benefits such as  perforation, bleeding, and infection were reviewed with the patient and is agreeable.

## 2012-04-03 DIAGNOSIS — Z79899 Other long term (current) drug therapy: Secondary | ICD-10-CM | POA: Diagnosis not present

## 2012-04-03 DIAGNOSIS — I10 Essential (primary) hypertension: Secondary | ICD-10-CM | POA: Diagnosis not present

## 2012-04-03 DIAGNOSIS — E785 Hyperlipidemia, unspecified: Secondary | ICD-10-CM | POA: Diagnosis not present

## 2012-04-09 DIAGNOSIS — I10 Essential (primary) hypertension: Secondary | ICD-10-CM | POA: Diagnosis not present

## 2012-04-09 DIAGNOSIS — E785 Hyperlipidemia, unspecified: Secondary | ICD-10-CM | POA: Diagnosis not present

## 2012-04-15 ENCOUNTER — Encounter (HOSPITAL_COMMUNITY): Payer: Self-pay | Admitting: Pharmacy Technician

## 2012-04-15 DIAGNOSIS — K219 Gastro-esophageal reflux disease without esophagitis: Secondary | ICD-10-CM | POA: Diagnosis not present

## 2012-04-15 DIAGNOSIS — J342 Deviated nasal septum: Secondary | ICD-10-CM | POA: Diagnosis not present

## 2012-04-15 DIAGNOSIS — J310 Chronic rhinitis: Secondary | ICD-10-CM | POA: Diagnosis not present

## 2012-04-15 DIAGNOSIS — J392 Other diseases of pharynx: Secondary | ICD-10-CM | POA: Diagnosis not present

## 2012-04-23 ENCOUNTER — Ambulatory Visit (HOSPITAL_COMMUNITY)
Admission: RE | Admit: 2012-04-23 | Discharge: 2012-04-23 | Disposition: A | Discharge status: Home / Self Care | Payer: Medicare Other | Source: Ambulatory Visit | Attending: Internal Medicine | Admitting: Internal Medicine

## 2012-04-23 ENCOUNTER — Encounter (HOSPITAL_COMMUNITY)
Admission: RE | Disposition: A | Discharge status: Home / Self Care | Payer: Self-pay | Source: Ambulatory Visit | Attending: Internal Medicine

## 2012-04-23 ENCOUNTER — Encounter (HOSPITAL_COMMUNITY): Payer: Self-pay | Admitting: *Deleted

## 2012-04-23 DIAGNOSIS — K219 Gastro-esophageal reflux disease without esophagitis: Secondary | ICD-10-CM

## 2012-04-23 DIAGNOSIS — K449 Diaphragmatic hernia without obstruction or gangrene: Secondary | ICD-10-CM | POA: Diagnosis not present

## 2012-04-23 DIAGNOSIS — Z79899 Other long term (current) drug therapy: Secondary | ICD-10-CM | POA: Diagnosis not present

## 2012-04-23 DIAGNOSIS — K319 Disease of stomach and duodenum, unspecified: Secondary | ICD-10-CM | POA: Insufficient documentation

## 2012-04-23 DIAGNOSIS — E785 Hyperlipidemia, unspecified: Secondary | ICD-10-CM | POA: Diagnosis not present

## 2012-04-23 DIAGNOSIS — I10 Essential (primary) hypertension: Secondary | ICD-10-CM | POA: Diagnosis not present

## 2012-04-23 DIAGNOSIS — K2990 Gastroduodenitis, unspecified, without bleeding: Secondary | ICD-10-CM | POA: Diagnosis not present

## 2012-04-23 HISTORY — PX: ESOPHAGOGASTRODUODENOSCOPY: SHX5428

## 2012-04-23 HISTORY — DX: Gastro-esophageal reflux disease without esophagitis: K21.9

## 2012-04-23 HISTORY — DX: Anemia, unspecified: D64.9

## 2012-04-23 HISTORY — DX: Unspecified osteoarthritis, unspecified site: M19.90

## 2012-04-23 SURGERY — EGD (ESOPHAGOGASTRODUODENOSCOPY)
Anesthesia: Moderate Sedation

## 2012-04-23 MED ORDER — MIDAZOLAM HCL 5 MG/5ML IJ SOLN
INTRAMUSCULAR | Status: AC
  Filled 2012-04-23: qty 10

## 2012-04-23 MED ORDER — MEPERIDINE HCL 25 MG/ML IJ SOLN
INTRAMUSCULAR | Status: DC | PRN
  Administered 2012-04-23: 25 mg via INTRAVENOUS

## 2012-04-23 MED ORDER — BENZONATATE 100 MG PO CAPS
200.0000 mg | ORAL_CAPSULE | Freq: Once | ORAL | Status: AC
  Administered 2012-04-23: 200 mg via ORAL

## 2012-04-23 MED ORDER — SUCRALFATE 1 GM/10ML PO SUSP: 2.0000 g | Freq: Every day | ORAL | Status: DC

## 2012-04-23 MED ORDER — MIDAZOLAM HCL 5 MG/5ML IJ SOLN
INTRAMUSCULAR | Status: DC | PRN
  Administered 2012-04-23: 1 mg via INTRAVENOUS
  Administered 2012-04-23: 2 mg via INTRAVENOUS
  Administered 2012-04-23: 1 mg via INTRAVENOUS

## 2012-04-23 MED ORDER — MEPERIDINE HCL 50 MG/ML IJ SOLN
INTRAMUSCULAR | Status: AC
  Filled 2012-04-23: qty 1

## 2012-04-23 MED ORDER — STERILE WATER FOR IRRIGATION IR SOLN
Status: DC | PRN
  Administered 2012-04-23: 15:00:00

## 2012-04-23 MED ORDER — SODIUM CHLORIDE 0.45 % IV SOLN
Freq: Once | INTRAVENOUS | Status: AC
  Administered 2012-04-23: 1000 mL via INTRAVENOUS

## 2012-04-23 NOTE — H&P (Signed)
Kathy Ford is an 78 y.o. female.   Chief Complaint: Patient is here for esophagogastroduodenoscopy. HPI: Patient is 78 year old Caucasian female with symptoms of GERD for over 50 years. She now presents with recurrent regurgitation heartburn as well as burning in her throat. Symptoms are worse at night. She was evaluated by Dr. Andrey Campanile of ENT service and nothing significant was found. He denies dysphagia to solids or liquids. She has good appetite. She also denies epigastric pain or melena..  Past Medical History  Diagnosis Date  . Hyperlipidemia   . Hypertension   . Glaucoma   . GERD (gastroesophageal reflux disease)   . Arthritis   . Anemia     Past Surgical History  Procedure Date  . Inguinal hernia repair 1998  . Laparoscopic cholecystectomy   . Total knee arthroplasty     x 2 (both) one in 2010 and 2011  . Joint replacement   . Breast biopsy     x2    Family History  Problem Relation Age of Onset  . Coronary artery disease Other   . COPD Other   . Stroke Other    Social History:  reports that she has never smoked. She does not have any smokeless tobacco history on file. She reports that she does not drink alcohol or use illicit drugs.  Allergies:  Allergies  Allergen Reactions  . Caine-1 (Lidocaine) Other (See Comments)    novocaine-passed out    Medications Prior to Admission  Medication Sig Dispense Refill  . amLODipine (NORVASC) 5 MG tablet Take 5 mg by mouth daily.      . bisoprolol-hydrochlorothiazide (ZIAC) 10-6.25 MG per tablet Take 1 tablet by mouth daily.        . brimonidine (ALPHAGAN) 0.15 % ophthalmic solution Place 1 drop into both eyes 2 (two) times daily.       . IPRATROPIUM BROMIDE NA Place 1 spray into the nose 2 (two) times daily.      Marland Kitchen latanoprost (XALATAN) 0.005 % ophthalmic solution Place 1 drop into both eyes daily.       Marland Kitchen losartan-hydrochlorothiazide (HYZAAR) 100-25 MG per tablet Take 1 tablet by mouth daily.      . Multiple Vitamin  (MULTIVITAMIN) tablet Take 1 tablet by mouth daily.        . potassium chloride SA (K-DUR,KLOR-CON) 20 MEQ tablet Take 20 mEq by mouth 3 (three) times daily.        . RABEprazole (ACIPHEX) 20 MG tablet Take 20 mg by mouth daily.      Marland Kitchen aspirin 81 MG tablet Take 81 mg by mouth daily.          No results found for this or any previous visit (from the past 48 hour(s)). No results found.  ROS  Blood pressure 176/78, pulse 57, temperature 97.7 F (36.5 C), temperature source Oral, resp. rate 19, height 5\' 2"  (1.575 m), weight 140 lb (63.504 kg), SpO2 95.00%. Physical Exam  Constitutional: She is oriented to person, place, and time. She appears well-developed and well-nourished.  HENT:  Mouth/Throat: Oropharynx is clear and moist.  Eyes: Conjunctivae are normal. No scleral icterus.  Neck: No thyromegaly present.  Cardiovascular: Normal rate, regular rhythm and normal heart sounds.   No murmur heard. Respiratory: Effort normal and breath sounds normal.  GI: Soft. She exhibits no distension. There is no tenderness.  Musculoskeletal: She exhibits no edema.  Lymphadenopathy:    She has no cervical adenopathy.  Neurological: She is alert and oriented  to person, place, and time.  Skin: Skin is warm and dry.     Assessment/Plan Chronic GERD refracturing to therapy. Diagnostic EGD.  REHMAN,NAJEEB U 04/23/2012, 3:20 PM

## 2012-04-23 NOTE — Op Note (Signed)
EGD PROCEDURE REPORT  PATIENT:  Kathy Ford  MR#:  [id number] Birthdate:  January 21, 1929, 78 y.o., female Endoscopist:  Dr. Malissa Hippo, MD Referred By:  Dr. Oneal Deputy. Juanetta Gosling, MD Procedure Date: 04/23/2012  Procedure:   EGD  Indications:  Patient is 78 year old Caucasian female with chronic GERD whose symptoms are not well controlled with therapy. She also has had her on this in her throat and hoarseness and she had evaluation by Dr. Josephina Shih and no polyps or other abnormalities were noted. She is undergoing diagnostic EGD.            Informed Consent:  The risks, benefits, alternatives & imponderables which include, but are not limited to, bleeding, infection, perforation, drug reaction and potential missed lesion have been reviewed.  The potential for biopsy, lesion removal, esophageal dilation, etc. have also been discussed.  Questions have been answered.  All parties agreeable.  Please see history & physical in medical record for more information.  Medications:  Demerol 25 mg IV Versed 4 mg IV Cetacaine spray topically for oropharyngeal anesthesia  Description of procedure:  The endoscope was introduced through the mouth and advanced to the second portion of the duodenum without difficulty or limitations. The mucosal surfaces were surveyed very carefully during advancement of the scope and upon withdrawal.  Findings:  Esophagus:  Patchy coating of esophageal mucosa with bile. No erosions or ulcers were noted. GEJ:  35 cm Hiatus:  38 cm Stomach:  There was moderate amount of bile in the stomach no food debris present. Stomach distended very well with insufflation. Folds in the proximal stomach were normal. Examination of mucosa at gastric body was normal. Diffuse patchy whitish discoloration to antral mucosa of the erosions or ulcers. Biopsy was taken. Wide-open pylorus. A nurse fundus and cardia were examined by retroflexing the scope and were normal. Duodenum:  Normal bulbar  and post bulbar mucosa.  Therapeutic/Diagnostic Maneuvers Performed:  Antral biopsy taken.  Complications:  None  Impression: No evidence of erosive esophagitis or Barrett's esophagus. Scant amount of bile noted in the esophagus suggesting alkaline reflux in addition to acid reflux which she has history of. Small sliding hiatal hernia. Abnormal appearance to antral mucosa most likely secondary to intestinal metaplasia. Biopsy taken to rule out other conditions.  Recommendations:  Continue anti-reflux measures and AcipHex at current dose. Carafate suspension 2 g by mouth each bedtime. I will contact patient with biopsy results and further recommendations.  Alexie Lanni U  04/23/2012  3:47 PM  CC: Dr. Fredirick Maudlin, MD & Dr. Bonnetta Barry ref. provider found Dr. Josephina Shih, MD

## 2012-04-30 ENCOUNTER — Encounter (INDEPENDENT_AMBULATORY_CARE_PROVIDER_SITE_OTHER): Payer: Self-pay | Admitting: *Deleted

## 2012-04-30 ENCOUNTER — Encounter (HOSPITAL_COMMUNITY): Payer: Self-pay | Admitting: Internal Medicine

## 2012-05-05 ENCOUNTER — Telehealth (INDEPENDENT_AMBULATORY_CARE_PROVIDER_SITE_OTHER): Payer: Self-pay | Admitting: *Deleted

## 2012-05-05 NOTE — Telephone Encounter (Signed)
Ov in 6 months.

## 2012-05-05 NOTE — Telephone Encounter (Signed)
Patient left message Saturday, Carafate is doing really good for her, she's not having any problems

## 2012-05-20 NOTE — Telephone Encounter (Signed)
Apt has been scheduled for 11/20/12 at 3:45 with Dorene Ar, NP.

## 2012-07-10 DIAGNOSIS — E785 Hyperlipidemia, unspecified: Secondary | ICD-10-CM | POA: Diagnosis not present

## 2012-07-10 DIAGNOSIS — L989 Disorder of the skin and subcutaneous tissue, unspecified: Secondary | ICD-10-CM | POA: Diagnosis not present

## 2012-07-10 DIAGNOSIS — I10 Essential (primary) hypertension: Secondary | ICD-10-CM | POA: Diagnosis not present

## 2012-07-26 DIAGNOSIS — Z23 Encounter for immunization: Secondary | ICD-10-CM | POA: Diagnosis not present

## 2012-08-06 DIAGNOSIS — H4011X0 Primary open-angle glaucoma, stage unspecified: Secondary | ICD-10-CM | POA: Diagnosis not present

## 2012-08-06 DIAGNOSIS — H524 Presbyopia: Secondary | ICD-10-CM | POA: Diagnosis not present

## 2012-08-20 DIAGNOSIS — H4011X0 Primary open-angle glaucoma, stage unspecified: Secondary | ICD-10-CM | POA: Diagnosis not present

## 2012-09-22 DIAGNOSIS — H4011X0 Primary open-angle glaucoma, stage unspecified: Secondary | ICD-10-CM | POA: Diagnosis not present

## 2012-09-22 DIAGNOSIS — H409 Unspecified glaucoma: Secondary | ICD-10-CM | POA: Diagnosis not present

## 2012-09-22 DIAGNOSIS — H26499 Other secondary cataract, unspecified eye: Secondary | ICD-10-CM | POA: Diagnosis not present

## 2012-10-09 DIAGNOSIS — M1710 Unilateral primary osteoarthritis, unspecified knee: Secondary | ICD-10-CM | POA: Diagnosis not present

## 2012-10-16 DIAGNOSIS — J342 Deviated nasal septum: Secondary | ICD-10-CM | POA: Diagnosis not present

## 2012-10-16 DIAGNOSIS — J392 Other diseases of pharynx: Secondary | ICD-10-CM | POA: Diagnosis not present

## 2012-10-16 DIAGNOSIS — J310 Chronic rhinitis: Secondary | ICD-10-CM | POA: Diagnosis not present

## 2012-11-12 DIAGNOSIS — M1990 Unspecified osteoarthritis, unspecified site: Secondary | ICD-10-CM | POA: Diagnosis not present

## 2012-11-12 DIAGNOSIS — L989 Disorder of the skin and subcutaneous tissue, unspecified: Secondary | ICD-10-CM | POA: Diagnosis not present

## 2012-11-12 DIAGNOSIS — I10 Essential (primary) hypertension: Secondary | ICD-10-CM | POA: Diagnosis not present

## 2012-11-20 ENCOUNTER — Ambulatory Visit (INDEPENDENT_AMBULATORY_CARE_PROVIDER_SITE_OTHER): Payer: Medicare Other | Admitting: Internal Medicine

## 2012-11-25 DIAGNOSIS — Z961 Presence of intraocular lens: Secondary | ICD-10-CM | POA: Diagnosis not present

## 2012-11-25 DIAGNOSIS — H40029 Open angle with borderline findings, high risk, unspecified eye: Secondary | ICD-10-CM | POA: Diagnosis not present

## 2012-11-25 DIAGNOSIS — H3589 Other specified retinal disorders: Secondary | ICD-10-CM | POA: Diagnosis not present

## 2012-11-27 ENCOUNTER — Ambulatory Visit (INDEPENDENT_AMBULATORY_CARE_PROVIDER_SITE_OTHER): Payer: Medicare Other | Admitting: Internal Medicine

## 2012-11-27 ENCOUNTER — Encounter (INDEPENDENT_AMBULATORY_CARE_PROVIDER_SITE_OTHER): Payer: Self-pay | Admitting: Internal Medicine

## 2012-11-27 VITALS — BP 102/52 | HR 64 | Temp 97.6°F | Ht 62.0 in | Wt 145.2 lb

## 2012-11-27 DIAGNOSIS — Z1211 Encounter for screening for malignant neoplasm of colon: Secondary | ICD-10-CM | POA: Diagnosis not present

## 2012-11-27 DIAGNOSIS — K219 Gastro-esophageal reflux disease without esophagitis: Secondary | ICD-10-CM

## 2012-11-27 DIAGNOSIS — I10 Essential (primary) hypertension: Secondary | ICD-10-CM | POA: Diagnosis not present

## 2012-11-27 MED ORDER — SUCRALFATE 1 GM/10ML PO SUSP: 2.0000 g | Freq: Every day | ORAL | Status: DC

## 2012-11-27 NOTE — Patient Instructions (Addendum)
Will schedule a colonoscopy in the near future. OV in 1 yr. Refill on Carafate

## 2012-11-27 NOTE — Progress Notes (Signed)
Subjective:     Patient ID: Kathy Ford, female   DOB: May 05, 1929, 79 y.o.   MRN: [id number]  HPI Here today ffor f/u of her GERD.  She tells me she is doing well. No acid reflux. She is taking Nexium for her acid reflux. Appetite is good. No weight loss. No abdominal pain.  Her BMs are normal. Usually has a BM once a day. No melena or bright red rectal bleeding.   04/23/2012 EGD: No evidence of erosive esophagitis or Barrett's esophagus.  Scant amount of bile noted in the esophagus suggesting alkaline reflux in addition to acid reflux which she has history of.  Small sliding hiatal hernia.  Abnormal appearance to antral mucosa most likely secondary to intestinal metaplasia. Biopsy taken to rule out other conditions.  Biopsy:  Gastric biopsy shows reactive changes and intestinal metaplasia. Suspect these changes are secondary to chronic due to no gastric bile reflux.  Saw Dr. Andrey Campanile for f/u after undergoing a laryngoscopy and everything was normal except she had a mucous pocket in her left sinus.    Review of Systems see hpi Current Outpatient Prescriptions  Medication Sig Dispense Refill  . amLODipine (NORVASC) 5 MG tablet Take 5 mg by mouth daily.      Marland Kitchen aspirin 81 MG tablet Take 81 mg by mouth daily.        . bisoprolol-hydrochlorothiazide (ZIAC) 10-6.25 MG per tablet Take 1 tablet by mouth daily.        . brimonidine (ALPHAGAN) 0.15 % ophthalmic solution Place 1 drop into both eyes 2 (two) times daily.       Marland Kitchen esomeprazole (NEXIUM) 40 MG capsule Take 40 mg by mouth daily before breakfast.      . IPRATROPIUM BROMIDE NA Place 1 spray into the nose as needed.       . latanoprost (XALATAN) 0.005 % ophthalmic solution Place 1 drop into both eyes daily.       Marland Kitchen losartan-hydrochlorothiazide (HYZAAR) 100-25 MG per tablet Take 1 tablet by mouth daily.      . Multiple Vitamin (MULTIVITAMIN) tablet Take 1 tablet by mouth daily.        . potassium chloride SA (K-DUR,KLOR-CON) 20 MEQ  tablet Take 20 mEq by mouth 3 (three) times daily.        . sucralfate (CARAFATE) 1 GM/10ML suspension Take 20 mLs (2 g total) by mouth at bedtime.  420 mL  11   No current facility-administered medications for this visit.   Past Medical History  Diagnosis Date  . Hyperlipidemia   . Hypertension   . Glaucoma   . GERD (gastroesophageal reflux disease)   . Arthritis   . Anemia    Past Surgical History  Procedure Laterality Date  . Inguinal hernia repair  1998  . Laparoscopic cholecystectomy    . Total knee arthroplasty      x 2 (both) one in 2010 and 2011  . Joint replacement    . Breast biopsy      x2  . Esophagogastroduodenoscopy  04/23/2012    Procedure: ESOPHAGOGASTRODUODENOSCOPY (EGD);  Surgeon: Malissa Hippo, MD;  Location: [facility];  Service: Endoscopy;  Laterality: N/A;  1200   Allergies  Allergen Reactions  . Caine-1 (Lidocaine) Other (See Comments)    novocaine-passed out        Objective:   Physical Exam  Filed Vitals:   11/27/12 1531  BP: 102/52  Pulse: 64  Temp: 97.6 F (36.4 C)  Height: 5\' 2"  (  1.575 m)  Weight: 145 lb 3.2 oz (65.862 kg)  Alert and oriented. Skin warm and dry. Oral mucosa is moist.   . Sclera anicteric, conjunctivae is pink. Thyroid not enlarged. No cervical lymphadenopathy. Lungs clear. Heart regular rate and rhythm.  Abdomen is soft. Bowel sounds are positive. No hepatomegaly. No abdominal masses felt. No tenderness.  No edema to lower extremities. Patient is alert and oriented. .     Assessment:    GERD, controlled at this time with Nexium. Colonoscopy greater than 20 yrs ago.     Plan:    OV in 1 yr. If any problems, please call our office. Will schedule a colonoscopy with Dr. Karilyn Cota. Refill of Carafate

## 2012-11-28 ENCOUNTER — Telehealth (INDEPENDENT_AMBULATORY_CARE_PROVIDER_SITE_OTHER): Payer: Self-pay | Admitting: *Deleted

## 2012-11-28 ENCOUNTER — Other Ambulatory Visit (INDEPENDENT_AMBULATORY_CARE_PROVIDER_SITE_OTHER): Payer: Self-pay | Admitting: *Deleted

## 2012-11-28 DIAGNOSIS — Z1211 Encounter for screening for malignant neoplasm of colon: Secondary | ICD-10-CM

## 2012-11-28 MED ORDER — PEG-KCL-NACL-NASULF-NA ASC-C 100 G PO SOLR: 1.0000 | Freq: Once | ORAL | Status: DC

## 2012-11-28 NOTE — Telephone Encounter (Signed)
Patient needs movi prep 

## 2012-12-09 ENCOUNTER — Telehealth (INDEPENDENT_AMBULATORY_CARE_PROVIDER_SITE_OTHER): Payer: Self-pay | Admitting: *Deleted

## 2012-12-09 ENCOUNTER — Encounter (HOSPITAL_COMMUNITY): Payer: Self-pay | Admitting: Pharmacy Technician

## 2012-12-09 NOTE — Telephone Encounter (Signed)
You saw patient on 11/27/12, sch'd her for TCS -- she calls in today to let you know a couple days after her OV she had some difficulty swallowing (liquid) and also had a lot of sinus drainage at same time, it went away for several days and then came back   She wants to know if she needs EGD/ED added to TCS --  If you need to talk to her, her phone# [phone]  please advise

## 2012-12-09 NOTE — Telephone Encounter (Signed)
She tells me she had trouble swallowing water. No trouble with foods. No trouble swallowing now.

## 2012-12-19 ENCOUNTER — Encounter (HOSPITAL_COMMUNITY)
Admission: RE | Disposition: A | Discharge status: Home / Self Care | Payer: Self-pay | Source: Ambulatory Visit | Attending: Internal Medicine

## 2012-12-19 ENCOUNTER — Ambulatory Visit (HOSPITAL_COMMUNITY)
Admission: RE | Admit: 2012-12-19 | Discharge: 2012-12-19 | Disposition: A | Discharge status: Home / Self Care | Payer: Medicare Other | Source: Ambulatory Visit | Attending: Internal Medicine | Admitting: Internal Medicine

## 2012-12-19 ENCOUNTER — Encounter (HOSPITAL_COMMUNITY): Payer: Self-pay | Admitting: *Deleted

## 2012-12-19 DIAGNOSIS — K644 Residual hemorrhoidal skin tags: Secondary | ICD-10-CM

## 2012-12-19 DIAGNOSIS — K5730 Diverticulosis of large intestine without perforation or abscess without bleeding: Secondary | ICD-10-CM

## 2012-12-19 DIAGNOSIS — Z1211 Encounter for screening for malignant neoplasm of colon: Secondary | ICD-10-CM

## 2012-12-19 DIAGNOSIS — I10 Essential (primary) hypertension: Secondary | ICD-10-CM | POA: Insufficient documentation

## 2012-12-19 HISTORY — PX: COLONOSCOPY: SHX5424

## 2012-12-19 LAB — HM COLONOSCOPY

## 2012-12-19 SURGERY — COLONOSCOPY
Anesthesia: Moderate Sedation

## 2012-12-19 MED ORDER — MIDAZOLAM HCL 5 MG/5ML IJ SOLN
INTRAMUSCULAR | Status: AC
  Filled 2012-12-19: qty 10

## 2012-12-19 MED ORDER — MEPERIDINE HCL 50 MG/ML IJ SOLN
INTRAMUSCULAR | Status: AC
  Filled 2012-12-19: qty 1

## 2012-12-19 MED ORDER — ATROPINE SULFATE 1 MG/ML IJ SOLN
INTRAMUSCULAR | Status: DC | PRN
  Administered 2012-12-19: .3 mg via INTRAVENOUS

## 2012-12-19 MED ORDER — MIDAZOLAM HCL 5 MG/5ML IJ SOLN
INTRAMUSCULAR | Status: DC | PRN
  Administered 2012-12-19: 2 mg via INTRAVENOUS
  Administered 2012-12-19: 1 mg via INTRAVENOUS

## 2012-12-19 MED ORDER — SODIUM CHLORIDE 0.45 % IV SOLN
INTRAVENOUS | Status: DC
  Administered 2012-12-19: 12:00:00 via INTRAVENOUS

## 2012-12-19 MED ORDER — ATROPINE SULFATE 1 MG/ML IJ SOLN
INTRAMUSCULAR | Status: AC
  Filled 2012-12-19: qty 1

## 2012-12-19 MED ORDER — MEPERIDINE HCL 50 MG/ML IJ SOLN
INTRAMUSCULAR | Status: DC | PRN
  Administered 2012-12-19 (×2): 20 mg via INTRAVENOUS

## 2012-12-19 NOTE — Op Note (Signed)
COLONOSCOPY PROCEDURE REPORT  PATIENT:  Kathy Ford  MR#:  [id number] Birthdate:  1928/10/29, 79 y.o., female Endoscopist:  Dr. Malissa Hippo, MD Referred By:  Dr. Oneal Deputy. Juanetta Gosling, MD Procedure Date: 12/19/2012  Procedure:   Colonoscopy  Indications:  Patient is 79 year old Caucasian female who is undergoing average risk for colonoscopy. Her last exam was 20 years ago.  Informed Consent:  The procedure and risks were reviewed with the patient and informed consent was obtained.  Medications:  Demerol 40 mg IV Versed 3 mg IV Atropine 0.3 mg IV for asymptomatic sinus bradycardia.  Description of procedure:  After a digital rectal exam was performed, that colonoscope was advanced from the anus through the rectum and colon to the area of the cecum, ileocecal valve and appendiceal orifice. The cecum was deeply intubated. These structures were well-seen and photographed for the record. From the level of the cecum and ileocecal valve, the scope was slowly and cautiously withdrawn. The mucosal surfaces were carefully surveyed utilizing scope tip to flexion to facilitate fold flattening as needed. The scope was pulled down into the rectum where a thorough exam including retroflexion was performed.  Findings:   Prep execellent. Multiple diverticula at sigmoid colon with few more at descending and ascending colon. No polyps or other mucosal abnormalities identified. Normal rectal mucosa. Hemorrhoids below the dentate line.    Therapeutic/Diagnostic Maneuvers Performed:  None  Complications:  None  Cecal Withdrawal Time:  9 minutes  Impression:  Examination performed to cecum. Multiple diverticula at sigmoid colon with few more at descending and ascending colon. External hemorrhoids.  Recommendations:  Standard instructions given. High fiber diet.  REHMAN,NAJEEB U  12/19/2012 12:45 PM  CC: Dr. Fredirick Maudlin, MD & Dr. Bonnetta Barry ref. Selina Tapper found

## 2012-12-19 NOTE — H&P (Signed)
Kathy Ford is an 79 y.o. female.   Chief Complaint: Patient is here for colonoscopy. HPI: Patient is 79 year old Caucasian female who is in for screening colonoscopy. Her last exam was 20 years ago. She denies abdominal pain change in her bowel habits or rectal bleeding. She has good appetite her weight has been stable. Family history is negative for colorectal carcinoma.  Past Medical History  Diagnosis Date  . Hypertension   . Glaucoma   . GERD (gastroesophageal reflux disease)   . Arthritis   . Anemia     Past Surgical History  Procedure Laterality Date  . Inguinal hernia repair  1998  . Laparoscopic cholecystectomy    . Total knee arthroplasty Bilateral     x 2 (both) one in 2010 and 2011  . Joint replacement    . Breast biopsy      x2  . Esophagogastroduodenoscopy  04/23/2012    Procedure: ESOPHAGOGASTRODUODENOSCOPY (EGD);  Surgeon: Malissa Hippo, MD;  Location: [facility];  Service: Endoscopy;  Laterality: N/A;  1200    Family History  Problem Relation Age of Onset  . Coronary artery disease Other   . COPD Other   . Stroke Other   . Colon polyps Sister   . Colon cancer Neg Hx    Social History:  reports that she has never smoked. She does not have any smokeless tobacco history on file. She reports that she does not drink alcohol or use illicit drugs.  Allergies:  Allergies  Allergen Reactions  . Bee Venom Anaphylaxis and Swelling  . Caine-1 (Lidocaine) Other (See Comments)    novocaine-passed out    Medications Prior to Admission  Medication Sig Dispense Refill  . amLODipine (NORVASC) 5 MG tablet Take 5 mg by mouth daily.      . bisoprolol-hydrochlorothiazide (ZIAC) 10-6.25 MG per tablet Take 1 tablet by mouth daily.        . brimonidine (ALPHAGAN) 0.15 % ophthalmic solution Place 1 drop into both eyes 3 (three) times daily.       . Calcium Carbonate (CALTRATE 600 PO) Take 1 tablet by mouth daily.      Marland Kitchen esomeprazole (NEXIUM) 40 MG capsule Take 40 mg  by mouth daily before breakfast.      . IPRATROPIUM BROMIDE NA Place 1 spray into the nose as needed (To dry up mucus in nasal area.).       Marland Kitchen latanoprost (XALATAN) 0.005 % ophthalmic solution Place 1 drop into both eyes daily.       Marland Kitchen losartan-hydrochlorothiazide (HYZAAR) 100-25 MG per tablet Take 1 tablet by mouth daily.      . Multiple Vitamin (MULTIVITAMIN) tablet Take 1 tablet by mouth daily.        . peg 3350 powder (MOVIPREP) 100 G SOLR Take 1 kit (100 g total) by mouth once.  1 kit  0  . potassium chloride SA (K-DUR,KLOR-CON) 20 MEQ tablet Take 20 mEq by mouth 3 (three) times daily.        Marland Kitchen aspirin 81 MG tablet Take 81 mg by mouth daily.          No results found for this or any previous visit (from the past 48 hour(s)). No results found.  ROS  Blood pressure 186/91, temperature 97.6 F (36.4 C), temperature source Oral, resp. rate 17, height 5\' 2"  (1.575 m), weight 145 lb (65.772 kg), SpO2 98.00%. Physical Exam  Constitutional: She appears well-developed and well-nourished.  HENT:  Mouth/Throat: Oropharynx is  clear and moist.  Eyes: Conjunctivae are normal. No scleral icterus.  Neck: No thyromegaly present.  Cardiovascular: Normal rate, regular rhythm and normal heart sounds.   No murmur heard. Respiratory: Effort normal.  GI: Soft. She exhibits no distension and no mass. There is no tenderness.  Musculoskeletal: She exhibits no edema.  Lymphadenopathy:    She has no cervical adenopathy.  Neurological: She is alert.  Skin: Skin is warm and dry.     Assessment/Plan Average risk screening colonoscopy.  REHMAN,NAJEEB U 12/19/2012, 12:14 PM

## 2012-12-19 NOTE — OR Nursing (Signed)
Heartrate down to 43 during procedure. Dr. Karilyn Cota aware and pulled back on scope. Atropine 0.3 mg given per MD order. Heartrate up to 58.

## 2012-12-24 ENCOUNTER — Encounter (HOSPITAL_COMMUNITY): Payer: Self-pay | Admitting: Internal Medicine

## 2013-05-11 DIAGNOSIS — H4010X0 Unspecified open-angle glaucoma, stage unspecified: Secondary | ICD-10-CM | POA: Diagnosis not present

## 2013-05-11 DIAGNOSIS — H4011X0 Primary open-angle glaucoma, stage unspecified: Secondary | ICD-10-CM | POA: Diagnosis not present

## 2013-05-11 DIAGNOSIS — H3589 Other specified retinal disorders: Secondary | ICD-10-CM | POA: Diagnosis not present

## 2013-05-13 DIAGNOSIS — E785 Hyperlipidemia, unspecified: Secondary | ICD-10-CM | POA: Diagnosis not present

## 2013-05-13 DIAGNOSIS — D649 Anemia, unspecified: Secondary | ICD-10-CM | POA: Diagnosis not present

## 2013-05-13 DIAGNOSIS — M1990 Unspecified osteoarthritis, unspecified site: Secondary | ICD-10-CM | POA: Diagnosis not present

## 2013-06-25 DIAGNOSIS — Z23 Encounter for immunization: Secondary | ICD-10-CM | POA: Diagnosis not present

## 2013-10-15 DIAGNOSIS — J342 Deviated nasal septum: Secondary | ICD-10-CM | POA: Diagnosis not present

## 2013-10-15 DIAGNOSIS — J392 Other diseases of pharynx: Secondary | ICD-10-CM | POA: Diagnosis not present

## 2013-10-15 DIAGNOSIS — J310 Chronic rhinitis: Secondary | ICD-10-CM | POA: Diagnosis not present

## 2013-10-29 DIAGNOSIS — K2100 Gastro-esophageal reflux disease with esophagitis, without bleeding: Secondary | ICD-10-CM | POA: Diagnosis not present

## 2013-10-29 DIAGNOSIS — M1990 Unspecified osteoarthritis, unspecified site: Secondary | ICD-10-CM | POA: Diagnosis not present

## 2013-10-29 DIAGNOSIS — D649 Anemia, unspecified: Secondary | ICD-10-CM | POA: Diagnosis not present

## 2013-10-29 DIAGNOSIS — E785 Hyperlipidemia, unspecified: Secondary | ICD-10-CM | POA: Diagnosis not present

## 2013-10-29 DIAGNOSIS — I10 Essential (primary) hypertension: Secondary | ICD-10-CM | POA: Diagnosis not present

## 2013-11-10 DIAGNOSIS — Z961 Presence of intraocular lens: Secondary | ICD-10-CM | POA: Diagnosis not present

## 2013-11-10 DIAGNOSIS — H4011X0 Primary open-angle glaucoma, stage unspecified: Secondary | ICD-10-CM | POA: Diagnosis not present

## 2013-11-10 DIAGNOSIS — H524 Presbyopia: Secondary | ICD-10-CM | POA: Diagnosis not present

## 2013-11-10 DIAGNOSIS — H5231 Anisometropia: Secondary | ICD-10-CM | POA: Diagnosis not present

## 2013-11-12 DIAGNOSIS — E785 Hyperlipidemia, unspecified: Secondary | ICD-10-CM | POA: Diagnosis not present

## 2013-11-12 DIAGNOSIS — M1990 Unspecified osteoarthritis, unspecified site: Secondary | ICD-10-CM | POA: Diagnosis not present

## 2013-11-12 DIAGNOSIS — I10 Essential (primary) hypertension: Secondary | ICD-10-CM | POA: Diagnosis not present

## 2013-11-12 DIAGNOSIS — K2100 Gastro-esophageal reflux disease with esophagitis, without bleeding: Secondary | ICD-10-CM | POA: Diagnosis not present

## 2013-12-30 ENCOUNTER — Encounter (INDEPENDENT_AMBULATORY_CARE_PROVIDER_SITE_OTHER): Payer: Self-pay | Admitting: *Deleted

## 2014-01-04 DIAGNOSIS — L821 Other seborrheic keratosis: Secondary | ICD-10-CM | POA: Diagnosis not present

## 2014-01-04 DIAGNOSIS — L819 Disorder of pigmentation, unspecified: Secondary | ICD-10-CM | POA: Diagnosis not present

## 2014-01-04 DIAGNOSIS — L570 Actinic keratosis: Secondary | ICD-10-CM | POA: Diagnosis not present

## 2014-02-12 ENCOUNTER — Encounter (HOSPITAL_COMMUNITY): Payer: Self-pay | Admitting: Emergency Medicine

## 2014-02-12 ENCOUNTER — Emergency Department (HOSPITAL_COMMUNITY)
Admission: EM | Admit: 2014-02-12 | Discharge: 2014-02-12 | Disposition: A | Discharge status: Home / Self Care | Payer: Medicare Other | Attending: Emergency Medicine | Admitting: Emergency Medicine

## 2014-02-12 DIAGNOSIS — K4090 Unilateral inguinal hernia, without obstruction or gangrene, not specified as recurrent: Secondary | ICD-10-CM | POA: Diagnosis not present

## 2014-02-12 DIAGNOSIS — Z7982 Long term (current) use of aspirin: Secondary | ICD-10-CM | POA: Insufficient documentation

## 2014-02-12 DIAGNOSIS — K219 Gastro-esophageal reflux disease without esophagitis: Secondary | ICD-10-CM | POA: Diagnosis not present

## 2014-02-12 DIAGNOSIS — M129 Arthropathy, unspecified: Secondary | ICD-10-CM | POA: Diagnosis not present

## 2014-02-12 DIAGNOSIS — H409 Unspecified glaucoma: Secondary | ICD-10-CM | POA: Insufficient documentation

## 2014-02-12 DIAGNOSIS — Z79899 Other long term (current) drug therapy: Secondary | ICD-10-CM | POA: Insufficient documentation

## 2014-02-12 DIAGNOSIS — K469 Unspecified abdominal hernia without obstruction or gangrene: Secondary | ICD-10-CM | POA: Diagnosis not present

## 2014-02-12 DIAGNOSIS — Z862 Personal history of diseases of the blood and blood-forming organs and certain disorders involving the immune mechanism: Secondary | ICD-10-CM | POA: Diagnosis not present

## 2014-02-12 DIAGNOSIS — I10 Essential (primary) hypertension: Secondary | ICD-10-CM | POA: Diagnosis not present

## 2014-02-12 DIAGNOSIS — R109 Unspecified abdominal pain: Secondary | ICD-10-CM | POA: Diagnosis not present

## 2014-02-12 DIAGNOSIS — E876 Hypokalemia: Secondary | ICD-10-CM | POA: Diagnosis not present

## 2014-02-12 LAB — COMPREHENSIVE METABOLIC PANEL
ALBUMIN: 3.6 g/dL (ref 3.5–5.2)
ALT: 17 U/L (ref 0–35)
AST: 24 U/L (ref 0–37)
Alkaline Phosphatase: 76 U/L (ref 39–117)
BILIRUBIN TOTAL: 0.7 mg/dL (ref 0.3–1.2)
BUN: 17 mg/dL (ref 6–23)
CALCIUM: 10 mg/dL (ref 8.4–10.5)
CHLORIDE: 92 meq/L — AB (ref 96–112)
CO2: 29 mEq/L (ref 19–32)
CREATININE: 0.59 mg/dL (ref 0.50–1.10)
GFR calc Af Amer: >90 mL/min (ref >90)
GFR calc non Af Amer: 83 mL/min — ABNORMAL LOW (ref >90)
Glucose, Bld: 141 mg/dL — ABNORMAL HIGH (ref 70–99)
Potassium: 3 mEq/L — ABNORMAL LOW (ref 3.7–5.3)
Sodium: 136 mEq/L — ABNORMAL LOW (ref 137–147)
Total Protein: 8.6 g/dL — ABNORMAL HIGH (ref 6.0–8.3)

## 2014-02-12 LAB — CBC WITH DIFFERENTIAL/PLATELET
BASOS ABS: 0 10*3/uL (ref 0.0–0.1)
BASOS PCT: 0 % (ref 0–1)
EOS ABS: 0 10*3/uL (ref 0.0–0.7)
EOS PCT: 0 % (ref 0–5)
HCT: 36.7 % (ref 36.0–46.0)
Hemoglobin: 12.7 g/dL (ref 12.0–15.0)
Lymphocytes Relative: 14 % (ref 12–46)
Lymphs Abs: 1.5 10*3/uL (ref 0.7–4.0)
MCH: 32.8 pg (ref 26.0–34.0)
MCHC: 34.6 g/dL (ref 30.0–36.0)
MCV: 94.8 fL (ref 78.0–100.0)
MONO ABS: 0.7 10*3/uL (ref 0.1–1.0)
MONOS PCT: 6 % (ref 3–12)
NEUTROS ABS: 8.7 10*3/uL — AB (ref 1.7–7.7)
Neutrophils Relative %: 80 % — ABNORMAL HIGH (ref 43–77)
PLATELETS: 352 10*3/uL (ref 150–400)
RBC: 3.87 MIL/uL (ref 3.87–5.11)
RDW: 13 % (ref 11.5–15.5)
WBC: 10.8 10*3/uL — AB (ref 4.0–10.5)

## 2014-02-12 MED ORDER — SODIUM CHLORIDE 0.9 % IV BOLUS (SEPSIS)
500.0000 mL | Freq: Once | INTRAVENOUS | Status: AC
  Administered 2014-02-12: 500 mL via INTRAVENOUS

## 2014-02-12 MED ORDER — FAMOTIDINE IN NACL 20-0.9 MG/50ML-% IV SOLN
20.0000 mg | Freq: Once | INTRAVENOUS | Status: AC
  Administered 2014-02-12: 20 mg via INTRAVENOUS
  Filled 2014-02-12: qty 50

## 2014-02-12 MED ORDER — ONDANSETRON HCL 4 MG/2ML IJ SOLN
4.0000 mg | INTRAMUSCULAR | Status: DC | PRN
  Administered 2014-02-12: 4 mg via INTRAVENOUS
  Filled 2014-02-12: qty 2

## 2014-02-12 MED ORDER — FENTANYL CITRATE 0.05 MG/ML IJ SOLN
12.5000 ug | INTRAMUSCULAR | Status: DC | PRN
  Administered 2014-02-12: 12.5 ug via INTRAVENOUS
  Filled 2014-02-12: qty 2

## 2014-02-12 MED ORDER — SODIUM CHLORIDE 0.9 % IV SOLN
INTRAVENOUS | Status: DC
  Administered 2014-02-12: 13:00:00 via INTRAVENOUS

## 2014-02-12 MED ORDER — POTASSIUM CHLORIDE 10 MEQ/100ML IV SOLN
10.0000 meq | Freq: Once | INTRAVENOUS | Status: AC
  Administered 2014-02-12: 10 meq via INTRAVENOUS
  Filled 2014-02-12: qty 100

## 2014-02-12 NOTE — Discharge Instructions (Signed)
°Emergency Department Resource Guide °1) Find a Doctor and Pay Out of Pocket °Although you won't have to find out who is covered by your insurance plan, it is a good idea to ask around and get recommendations. You will then need to call the office and see if the doctor you have chosen will accept you as a new patient and what types of options they offer for patients who are self-pay. Some doctors offer discounts or will set up payment plans for their patients who do not have insurance, but you will need to ask so you aren't surprised when you get to your appointment. ° °2) Contact Your Local Health Department °Not all health departments have doctors that can see patients for sick visits, but many do, so it is worth a call to see if yours does. If you don't know where your local health department is, you can check in your phone book. The CDC also has a tool to help you locate your state's health department, and many state websites also have listings of all of their local health departments. ° °3) Find a Walk-in Clinic °If your illness is not likely to be very severe or complicated, you may want to try a walk in clinic. These are popping up all over the country in pharmacies, drugstores, and shopping centers. They're usually staffed by nurse practitioners or physician assistants that have been trained to treat common illnesses and complaints. They're usually fairly quick and inexpensive. However, if you have serious medical issues or chronic medical problems, these are probably not your best option. ° °No Primary Care Doctor: °- Call [facility] at  832-8000 - they can help you locate a primary care doctor that  accepts your insurance, provides certain services, etc. °- Physician Referral Service- 1-800-533-3463 ° °Chronic Pain Problems: °Organization         Address  Phone   Notes  °[facility]  (336) 297-2271 Patients need to be referred by their primary care doctor.  ° °Medication  Assistance: °Organization         Address  Phone   Notes  °[place] [facility] [address] °[place], [place] [address] (336) 641-8030 --Must be a resident of [place] °-- Must have NO insurance coverage whatsoever (no Medicaid/ Medicare, etc.) °-- The pt. MUST have a primary care doctor that directs their care regularly and follows them in the community °  °[facility]  (866) 331-1348   °[employer]  (888) 892-1162   ° °Agencies that provide inexpensive medical care: °Organization         Address  Phone   Notes  °[facility]  (336) 832-8035   °[facility]    (336) 832-7272   °[facility] [address] °[place], [place] [address] (336) 832-4777   °[facility] [address], °[place] (336) 271-4999   °[facility]    (336) 373-0678   °[facility]    (336) 272-1050   °[facility] ° [address], [place] Phone:  (336) 832-4444, Fax:  (336) 832-4440 Hours of Operation:  9 am - 6 pm, M-F.  Also accepts Medicaid/Medicare and self-pay.  °[facility] ° [address], [place] Phone: (336) 832-3150, Fax: (336) [phone]. Hours of Operation:  8:30 am - 5:30 pm, M-F.  Also accepts Medicaid and self-pay.  °[facility] [place] [address], [place] Phone: (336) 878-6027   °[facility] [address], [place], [place] (336)723-1848, Ext. 123 Mondays & Thursdays: 7-9 AM.  First 15 patients are seen on a first come, first serve basis. °  ° °Medicaid-accepting [place] Providers: ° °Organization         Address  Phone   Notes  °[facility] [address], [place] (336) 641-2100 Also accepts self-pay patients.  °[facility] [address], [place] ° (336) 856-9996   °[facility] [address], [place]  (336) 288-8857   °[facility] [address] [place] [address], [place] (336) 299-7000   °Veita Bland [address], [place]  ° (336) 373-1557 Only accepts [place] Access Medicaid patients after they have their name applied to their card.  ° °Self-Pay (no insurance) in [place]: ° °Organization         Address  Phone   Notes  °Sickle Cell Patients, [facility] [address], [place] (336) 832-1970   °[facility] [address], [place] (336) 832-4400   °[facility] [facility] ° [address], [place] (336) 992-4800   °[facility]/Dr. Osei-Bonsu ° [address] [place] [address], [place] or [address], [place] (336) [phone] Phone number for both [place] and [place] locations is the same.  °[facility] [address], [place] (336) 299-0000   °[facility] [address] [place] [address], [place] or [address] (336) 852-7530 °(336) 878-2260   °[facility] [address], [place] (336) 350-1642, phone; (336) 294-5005, fax Sees patients 1st and 3rd Saturday of every month.  Must not qualify for public or private insurance (i.e. Medicaid, Medicare, [place] Health Choice, Veterans' Benefits) • Household income should be no more than 200% of the poverty level •The clinic cannot treat you if you are pregnant or think you are pregnant • Sexually transmitted diseases are not treated at the clinic.  ° ° °Dental Care: °Organization         Address  Phone  Notes  °[place] [facility] [address], [place] (336) 641-6152 Accepts children up to age 21 who are enrolled in Medicaid or [place] Health Choice; pregnant women with a Medicaid card; and children who have applied for Medicaid or [place] Health Choice, but were declined, whose parents can pay a reduced fee at time of service.  °[place]  [facility] [place]  [address], [place] (336) 641-7733 Accepts children up to age 21 who are enrolled in Medicaid or [place] Health Choice; pregnant women with a Medicaid card; and children who have applied for Medicaid or [place] Health Choice, but were declined, whose parents can pay a reduced fee at time of service.  °[facility] ° [address], [place] (336) 641-4533 Patients are seen by appointment only. Walk-ins are not accepted. [facility] will see patients 18 years of age and older. °Monday - Tuesday (8am-5pm) °Most Wednesdays (8:30-5pm) °$30 per visit, cash only  °[facility] ° [address], [place] (336) 641-4533 Patients are seen by appointment only. Walk-ins are not accepted. [facility] will see patients 18 years of age and older. °One   Wednesday Evening (Monthly: Volunteer Based).  $30 per visit, cash only  °[facility]  (919) 537-3737 for adults; Children under age 4, call Graduate Pediatric Dentistry at (919) [phone]. Children aged 4-14, please call (919) [phone] to request a pediatric application. ° Dental services are provided in all areas of dental care including fillings, crowns and bridges, complete and partial dentures, implants, gum treatment, root canals, and extractions. Preventive care is also provided. Treatment is provided to both adults and children. °Patients are selected via a lottery and there is often a waiting list. °  °[facility] [address], °[place] ° (336) 763-8833 [email or web address].[email or web address] °  °[facility] [address], [place], [place] (336)723-1848, Ext. 123 Second and Fourth Thursday of each month, opens at 6:30 AM; Clinic ends at 9 AM.  Patients are seen on a first-come first-served basis, and a limited number are seen during each clinic.  ° °[facility] ° [address], [place], [place] (336) [phone]    Eligibility Requirements °You must have lived in [place], [place], or [place] counties for at least the last three months. °  You cannot be eligible for state or federal sponsored healthcare insurance, including Veterans Administration, Medicaid, or Medicare. °  You generally cannot be eligible for healthcare insurance through your employer.  °  How to apply: °Eligibility screenings are held every Tuesday and Wednesday afternoon from 1:00 pm until 4:00 pm. You do not need an appointment for the interview!  °[facility] [address], [place], [place] 336-631-2330   °[facility]  336-342-8273   °[facility]  336-703-3100   °[facility]  336-570-6415   ° °Behavioral Health Resources in the Community: °Intensive Outpatient Programs °Organization         Address  Phone  Notes  °[facility] [address], [place], [place] 336-878-6098   °[facility] [address], [place], [place] 336-832-9800   °[facility] [address], [place], [place] ° 336-882-2125   °[facility] [address],  °[place], [place] 1-800-853-5163 or 336-641-4981   °Substance Abuse Resources °Organization         Address  Phone  Notes  °[facility]  336-882-2125   °[facility]  336-784-9470   °[facility]  336-285-9073   °[facility]  336-845-3988   °Residential & Outpatient Substance Abuse Program  1-800-659-3381   °Psychological Services °Organization         Address  Phone  Notes  ° Health  336- 832-9600   °[facility]  336- 378-7881   °[facility] [address], [place] 1-800-853-5163 or 336-641-4981   ° °Mobile Crisis Teams °Organization         Address  Phone  Notes  °[facility], Mobile Crisis Care Unit  1-877-626-1772   °Assertive °[facility] ° [address]  [place], [place] 336-834-9664   °Sharon DeEsch [address] °[place] 336-554-5454   ° °Self-Help/Support Groups °Organization         Address  Phone             Notes  °[facility]  - variety of support groups  336- 373-1402 Call for more information  °Narcotics Anonymous (NA), [facility] [address], °[place]  2 meetings at this location  ° °  Residential Treatment Programs Organization         Address  Phone  Notes  [facility] [address]    [place]  1-619 151 2295   [facility]  [address], [place] [address], [place], [place]   [place], [place] (337)048-9851 Admissions: 8am-3pm M-F  [facility] [address] [place] [address],    [place], [place] [phone]   [facility] [address] [place], [place], [place]   [facility] [address]  [place], [place]   [facility] Intensive Outpatient [address], [place], [place]   [facility] [address]  [place], [place] 1-913-772-8850 or 971-748-0744   [facility] [address] [place], [place] Accepts Medicaid  [facility] [address]  [place] 1-816-394-3992 Substance Abuse/Addiction Treatment   [facility]         Address  Phone  Notes  [facility]  808-831-2159   Domenic Schwab, PhD [address] Arlis Porta [place], [place]   910-823-5424 or (479) 541-4114   [place], [place] (305)[phone]   [facility] [address], [place], [place] (337)704-8317 Insurance/Medicaid/sponsorship through [facility] Families [address]                                    [place], [place] [phone] [place] [address], [place] [phone]    Dr. Adele Schilder  [phone]   [facility] Dept. 1) [address], [place] 2) [place] 3)  [address], [place] [phone]  ([phone]   [place] [phone] or [phone] (After Hours)       Take your usual prescriptions as previously directed.  Call your General Surgeon today to schedule a follow up appointment within the next week.  Return to the Emergency Department immediately sooner if worsening.

## 2014-02-12 NOTE — ED Notes (Addendum)
Pt reports felt something hurt in llq yesterday while cooking supper.  Reports feeling hernia in lower abd.  Reports vomiting several time since then.  Dr. Arnoldo Morale at bedside attempting to reduce hernia.

## 2014-02-12 NOTE — ED Notes (Signed)
abd pain ,N/V   Lt inquinal hernia is "bulging"

## 2014-02-12 NOTE — ED Provider Notes (Signed)
CSN: [id number]     Arrival date & time 02/12/14  1102 History   First MD Initiated Contact with Patient 02/12/14 1157     Chief Complaint  Patient presents with  . Abdominal Pain      HPI Pt was seen at 1155.  Per pt, c/o gradual onset and persistence of constant LLQ abd "pain" since yesterday.  Has been associated with multiple intermittent episodes of N/V.  Describes the abd pain as "my hernia is out." Pt states she has long hx of left inguinal hernia and is usually able to reduce it easily. Since last night she has been unable to. Pt was evaluated at her PMD's office PTA, he was also unable to reduce the hernia, and pt was sent to the ED for further evaluation by General Surgery Dr. Arnoldo Morale.  Denies diarrhea, no fevers, no back pain, no rash, no CP/SOB, no black or blood in stools or emesis.       Past Medical History  Diagnosis Date  . Hypertension   . Glaucoma   . GERD (gastroesophageal reflux disease)   . Arthritis   . Anemia    Past Surgical History  Procedure Laterality Date  . Inguinal hernia repair  1998  . Laparoscopic cholecystectomy    . Total knee arthroplasty Bilateral     x 2 (both) one in 2010 and 2011  . Joint replacement    . Esophagogastroduodenoscopy  04/23/2012    Procedure: ESOPHAGOGASTRODUODENOSCOPY (EGD);  Surgeon: Rogene Houston, MD;  [facility];  Service: Endoscopy;  Laterality: N/A;  1200  . Colonoscopy N/A 12/19/2012    Procedure: COLONOSCOPY;  Surgeon: Rogene Houston, MD;  [facility];  Service: Endoscopy;  Laterality: N/A;  1030-moved to 1225 Ann to notify pt  . Breast biopsy      x2   Family History  Problem Relation Age of Onset  . Coronary artery disease Other   . COPD Other   . Stroke Other   . Colon polyps Sister   . Colon cancer Neg Hx    History  Substance Use Topics  . Smoking status: Never Smoker   . Smokeless tobacco: Not on file  . Alcohol Use: No    Review of Systems ROS: Statement: All systems  negative except as marked or noted in the HPI; Constitutional: Negative for fever and chills. ; ; Eyes: Negative for eye pain, redness and discharge. ; ; ENMT: Negative for ear pain, hoarseness, nasal congestion, sinus pressure and sore throat. ; ; Cardiovascular: Negative for chest pain, palpitations, diaphoresis, dyspnea and peripheral edema. ; ; Respiratory: Negative for cough, wheezing and stridor. ; ; Gastrointestinal: +N/V, abd pain. Negative for diarrhea, blood in stool, hematemesis, jaundice and rectal bleeding. . ; ; Genitourinary: Negative for dysuria, flank pain and hematuria. ; ; Musculoskeletal: Negative for back pain and neck pain. Negative for swelling and trauma.; ; Skin: Negative for pruritus, rash, abrasions, blisters, bruising and skin lesion.; ; Neuro: Negative for headache, lightheadedness and neck stiffness. Negative for weakness, altered level of consciousness , altered mental status, extremity weakness, paresthesias, involuntary movement, seizure and syncope.      Allergies  Bee venom and Caine-1  Home Medications   Prior to Admission medications   Medication Sig Start Date End Date Taking? Authorizing Provider  amLODipine (NORVASC) 5 MG tablet Take 5 mg by mouth daily.   Yes Historical Provider, MD  aspirin 81 MG tablet Take 81 mg by mouth daily.  Yes Historical Provider, MD  bisoprolol-hydrochlorothiazide (ZIAC) 10-6.25 MG per tablet Take 1 tablet by mouth daily.     Yes Historical Provider, MD  brimonidine (ALPHAGAN) 0.15 % ophthalmic solution Place 1 drop into both eyes 3 (three) times daily.    Yes Historical Provider, MD  Calcium Carbonate (CALTRATE 600 PO) Take 1 tablet by mouth daily.   Yes Historical Provider, MD  esomeprazole (NEXIUM) 40 MG capsule Take 40 mg by mouth daily before breakfast.   Yes Historical Provider, MD  IPRATROPIUM BROMIDE NA Place 1 spray into the nose as needed (To dry up mucus in nasal area.).    Yes Historical Provider, MD  latanoprost  (XALATAN) 0.005 % ophthalmic solution Place 1 drop into both eyes daily.    Yes Historical Provider, MD  losartan-hydrochlorothiazide (HYZAAR) 100-25 MG per tablet Take 1 tablet by mouth daily.   Yes Historical Provider, MD  Multiple Vitamin (MULTIVITAMIN) tablet Take 1 tablet by mouth daily.     Yes Historical Provider, MD  potassium chloride SA (K-DUR,KLOR-CON) 20 MEQ tablet Take 20 mEq by mouth 3 (three) times daily.     Yes Historical Provider, MD   BP 196/63  Pulse 60  Temp(Src) 97.9 F (36.6 C) (Oral)  Resp 20  Ht 5\' 2"  (1.575 m)  Wt 140 lb (63.504 kg)  BMI 25.60 kg/m2  SpO2 98% Physical Exam 1200: Physical examination:  Nursing notes reviewed; Vital signs and O2 SAT reviewed;  Constitutional: Well developed, Well nourished, Well hydrated, Uncomfortable appearing.; Head:  Normocephalic, atraumatic; Eyes: EOMI, PERRL, No scleral icterus; ENMT: Mouth and pharynx normal, Mucous membranes moist; Neck: Supple, Full range of motion, No lymphadenopathy; Cardiovascular: Regular rate and rhythm, No gallop; Respiratory: Breath sounds clear & equal bilaterally, No wheezes.  Speaking full sentences with ease, Normal respiratory effort/excursion; Chest: Nontender, Movement normal; Abdomen: Soft, +left inguinal hernia. No overlying abd wall erythema or ecchymosis. Nondistended, Normal bowel sounds; Genitourinary: No CVA tenderness; Extremities: Pulses normal, No tenderness, No edema, No calf edema or asymmetry.; Neuro: AA&Ox3, Major CN grossly intact.  Speech clear. No gross focal motor or sensory deficits in extremities.; Skin: Color normal, Warm, Dry.   ED Course  Procedures     EKG Interpretation None      MDM  MDM Reviewed: previous chart, vitals and nursing note Reviewed previous: labs Interpretation: labs    Results for orders placed during the hospital encounter of 02/12/14  CBC WITH DIFFERENTIAL      Result Value Ref Range   WBC 10.8 (*) 4.0 - 10.5 K/uL   RBC 3.87  3.87 - 5.11  MIL/uL   Hemoglobin 12.7  12.0 - 15.0 g/dL   HCT 36.7  36.0 - 46.0 %   MCV 94.8  78.0 - 100.0 fL   MCH 32.8  26.0 - 34.0 pg   MCHC 34.6  30.0 - 36.0 g/dL   RDW 13.0  11.5 - 15.5 %   Platelets 352  150 - 400 K/uL   Neutrophils Relative % 80 (*) 43 - 77 %   Neutro Abs 8.7 (*) 1.7 - 7.7 K/uL   Lymphocytes Relative 14  12 - 46 %   Lymphs Abs 1.5  0.7 - 4.0 K/uL   Monocytes Relative 6  3 - 12 %   Monocytes Absolute 0.7  0.1 - 1.0 K/uL   Eosinophils Relative 0  0 - 5 %   Eosinophils Absolute 0.0  0.0 - 0.7 K/uL   Basophils Relative 0  0 - 1 %  Basophils Absolute 0.0  0.0 - 0.1 K/uL  COMPREHENSIVE METABOLIC PANEL      Result Value Ref Range   Sodium 136 (*) 137 - 147 mEq/L   Potassium 3.0 (*) 3.7 - 5.3 mEq/L   Chloride 92 (*) 96 - 112 mEq/L   CO2 29  19 - 32 mEq/L   Glucose, Bld 141 (*) 70 - 99 mg/dL   BUN 17  6 - 23 mg/dL   Creatinine, Ser 0.59  0.50 - 1.10 mg/dL   Calcium 10.0  8.4 - 10.5 mg/dL   Total Protein 8.6 (*) 6.0 - 8.3 g/dL   Albumin 3.6  3.5 - 5.2 g/dL   AST 24  0 - 37 U/L   ALT 17  0 - 35 U/L   Alkaline Phosphatase 76  39 - 117 U/L   Total Bilirubin 0.7  0.3 - 1.2 mg/dL   GFR calc non Af Amer 83 (*) >90 mL/min   GFR calc Af Amer >90  >90 mL/min     1155: Pt with N/V and abd pain, irreducible hernia. General Surgery Dr. Arnoldo Morale at bedside (he was called by pt's PMD and informed pt was coming to the ED): he reduced pt's left inguinal hernia. Pt immediately stated she "felt my hernia go back in," and her pain was "better." Continues to c/o mild nausea. Pt has had multiple episodes of emesis since last night; will dose IVF, antiemetics and pain meds. Dr. Arnoldo Morale states he will be back to the ED for re-eval.  1400:  Dr. Arnoldo Morale has returned to the ED for pt re-eval: states hernia continues reduced and pt can go home now, f/u in ofc to arrange elective repair.  Pt continues to feel "better" since hernia was reduced, and has no further episodes of N/V.  Pt wants to go home  now. Dx and testing d/w pt and family.  Questions answered.  Verb understanding, agreeable to d/c home with outpt f/u.     Alfonzo Feller, DO 02/14/14 [phone]

## 2014-02-16 DIAGNOSIS — K4090 Unilateral inguinal hernia, without obstruction or gangrene, not specified as recurrent: Secondary | ICD-10-CM | POA: Diagnosis not present

## 2014-02-16 NOTE — H&P (Signed)
  NTS SOAP Note  Vital Signs:  Vitals as of: 9/67/5916: Systolic 384: Diastolic 81: Heart Rate 59: Temp 98.10F: Height 34ft 0in: Weight 147Lbs 0 Ounces: BMI 28.71  BMI : 28.71 kg/m2  Subjective: This 80 Years 2 Months old Female presents for of a recurrent left inguinal hernia.  Was originally repaired 25 years ago.  Was seen in ER for incarceration, reduced at bedside.  Now presents to schedule repair.  Review of Symptoms:  Constitutional:unremarkable   Head:unremarkable    Eyes:unremarkable   sinus problems Cardiovascular:  unremarkable   Respiratory:unremarkable   Gastrointestin    heartburn Genitourinary:    dysuria   joint pain Skin:unremarkable Hematolgic/Lymphatic:unremarkable     Allergic/Immunologic:unremarkable     Past Medical History:    Reviewed  Past Medical History  Surgical History: left inguinal herniorrhaphy, jooint replacement, lap cholecystectomy, TCS, EGD, breast biopsy x 2 Medical Problems: HTN, glaucoma, GERD, arthritis, anemia Allergies: lidocain preservative Medications: norvasc, baby asa, ziac, alphagan, caltrate, nexium, eye drops, hyzaar, klor-con   Social History:Reviewed  Social History  Preferred Language: English Race:  White Ethnicity: Not Hispanic / Latino Age: 80 Years 6 Months Marital Status:  M Alcohol: no Recreational drug(s): no   Smoking Status: Never smoker reviewed on 02/16/2014 Functional Status reviewed on 02/16/2014 ------------------------------------------------ Bathing: Normal Cooking: Normal Dressing: Normal Driving: Normal Eating: Normal Managing Meds: Normal Oral Care: Normal Shopping: Normal Toileting: Normal Transferring: Normal Walking: Normal Cognitive Status reviewed on 02/16/2014 ------------------------------------------------ Attention: Normal Decision Making: Normal Language: Normal Memory: Normal Motor: Normal Perception: Normal Problem Solving:  Normal Visual and Spatial: Normal   Family History:  Reviewed  Family Health History Family History is Unknown    Objective Information: General:  Well appearing, well nourished in no distress. Heart:  RRR, no murmur or gallop.  Normal S1, S2.  No S3, S4.  Lungs:    CTA bilaterally, no wheezes, rhonchi, rales.  Breathing unlabored. Abdomen:Soft, NT/ND, no HSM, no masses.  Reduciible left inguinal hernia noted.  Assessment:Left inguinal hernia, recurrent  Diagnoses: 550.90 Inguinal hernia (Unilateral inguinal hernia, without obstruction or gangrene, not specified as recurrent)  Procedures: 66599 - OFFICE OUTPATIENT NEW 30 MINUTES    Plan:  Scheduled for recurrent left inguinal herniorrhaphy with mesh on 02/19/14.   Patient Education:Alternative treatments to surgery were discussed with patient (and family).  Risks and benefits  of procedure including bleeding, infection, and recurrence of the hernia were fully explained to the patient (and family) who gave informed consent. Patient/family questions were addressed.  Follow-up:Pending Surgery

## 2014-02-16 NOTE — Patient Instructions (Signed)
LARYSA PALL  02/16/2014   Your procedure is scheduled on:  02/19/2014  Report to [facility] at  73  AM.  Call this number if you have problems the morning of surgery: [phone]   Remember:   Do not eat food or drink liquids after midnight.   Take these medicines the morning of surgery with A SIP OF WATER: amlodipine, bisoprolol, nexium, losartan   Do not wear jewelry, make-up or nail polish.  Do not wear lotions, powders, or perfumes.   Do not shave 48 hours prior to surgery. Men may shave face and neck.  Do not bring valuables to the hospital.  [facility] is not responsible for any belongings or valuables.               Contacts, dentures or bridgework may not be worn into surgery.  Leave suitcase in the car. After surgery it may be brought to your room.  For patients admitted to the hospital, discharge time is determined by your treatment team.               Patients discharged the day of surgery will not be allowed to drive home.  Name and phone number of your driver: family  Special Instructions: Shower using CHG 2 nights before surgery and the night before surgery.  If you shower the day of surgery use CHG.  Use special wash - you have one bottle of CHG for all showers.  You should use approximately 1/3 of the bottle for each shower.   Please read over the following fact sheets that you were given: Pain Booklet, Coughing and Deep Breathing, Surgical Site Infection Prevention, Anesthesia Post-op Instructions and Care and Recovery After Surgery Hernia A hernia happens when an organ inside your body pushes out through a weak spot in your belly (abdominal) wall. Most hernias get worse over time. They can often be pushed back into place (reduced). Surgery may be needed to repair hernias that cannot be pushed into place. HOME CARE  Keep doing normal activities.  Avoid lifting more than 10 pounds (4.5 kilograms).  Cough gently and avoid straining. Over time, these  things will:  Increase your hernia size.  Irritate your hernia.  Break down hernia repairs.  Stop smoking.  Do not wear anything tight over your hernia. Do not keep the hernia in with an outside bandage.  Eat food that is high in fiber (fruit, vegetables, whole grains).  Drink enough fluids to keep your pee (urine) clear or pale yellow.  Take medicines to make your poop soft (stool softeners) if you cannot poop (constipated). GET HELP RIGHT AWAY IF:   You have a fever.  You have belly pain that gets worse.  You feel sick to your stomach (nauseous) and throw up (vomit).  Your skin starts to bulge out.  Your hernia turns a different color, feels hard, or is tender.  You have increased pain or puffiness (swelling) around the hernia.  You poop more or less often.  Your poop does not look the way normally does.  You have watery poop (diarrhea).  You cannot push the hernia back in place by applying gentle pressure while lying down. MAKE SURE YOU:   Understand these instructions.  Will watch your condition.  Will get help right away if you are not doing well or get worse. Document Released: 03/14/2010 Document Revised: 12/17/2011 Document Reviewed: 03/14/2010 [facility] Patient Information 2014 [place]. PATIENT INSTRUCTIONS POST-ANESTHESIA  IMMEDIATELY FOLLOWING SURGERY:  Do not drive or operate machinery for the first twenty four hours after surgery.  Do not make any important decisions for twenty four hours after surgery or while taking narcotic pain medications or sedatives.  If you develop intractable nausea and vomiting or a severe headache please notify your doctor immediately.  FOLLOW-UP:  Please make an appointment with your surgeon as instructed. You do not need to follow up with anesthesia unless specifically instructed to do so.  WOUND CARE INSTRUCTIONS (if applicable):  Keep a dry clean dressing on the anesthesia/puncture wound site if there is  drainage.  Once the wound has quit draining you may leave it open to air.  Generally you should leave the bandage intact for twenty four hours unless there is drainage.  If the epidural site drains for more than 36-48 hours please call the anesthesia department.  QUESTIONS?:  Please feel free to call your physician or the hospital operator if you have any questions, and they will be happy to assist you.

## 2014-02-17 ENCOUNTER — Encounter (HOSPITAL_COMMUNITY)
Admission: RE | Admit: 2014-02-17 | Discharge: 2014-02-17 | Disposition: A | Discharge status: Home / Self Care | Payer: Medicare Other | Source: Ambulatory Visit | Attending: General Surgery | Admitting: General Surgery

## 2014-02-17 ENCOUNTER — Encounter (HOSPITAL_COMMUNITY): Payer: Self-pay

## 2014-02-17 ENCOUNTER — Other Ambulatory Visit: Payer: Self-pay

## 2014-02-17 ENCOUNTER — Encounter (HOSPITAL_COMMUNITY): Payer: Self-pay | Admitting: Pharmacy Technician

## 2014-02-17 DIAGNOSIS — K219 Gastro-esophageal reflux disease without esophagitis: Secondary | ICD-10-CM | POA: Diagnosis not present

## 2014-02-17 DIAGNOSIS — D649 Anemia, unspecified: Secondary | ICD-10-CM | POA: Diagnosis not present

## 2014-02-17 DIAGNOSIS — Z79899 Other long term (current) drug therapy: Secondary | ICD-10-CM | POA: Diagnosis not present

## 2014-02-17 DIAGNOSIS — M129 Arthropathy, unspecified: Secondary | ICD-10-CM | POA: Diagnosis not present

## 2014-02-17 DIAGNOSIS — I10 Essential (primary) hypertension: Secondary | ICD-10-CM | POA: Diagnosis not present

## 2014-02-17 DIAGNOSIS — Z7982 Long term (current) use of aspirin: Secondary | ICD-10-CM | POA: Diagnosis not present

## 2014-02-17 DIAGNOSIS — K4031 Unilateral inguinal hernia, with obstruction, without gangrene, recurrent: Secondary | ICD-10-CM | POA: Diagnosis not present

## 2014-02-17 DIAGNOSIS — H409 Unspecified glaucoma: Secondary | ICD-10-CM | POA: Diagnosis not present

## 2014-02-17 HISTORY — DX: Nausea with vomiting, unspecified: R11.2

## 2014-02-17 HISTORY — DX: Unspecified urinary incontinence: R32

## 2014-02-17 HISTORY — DX: Other specified postprocedural states: Z98.890

## 2014-02-17 LAB — POTASSIUM: POTASSIUM: 3.9 meq/L (ref 3.7–5.3)

## 2014-02-17 NOTE — Pre-Procedure Instructions (Signed)
Patient given information to sign up for my chart at home. 

## 2014-02-19 ENCOUNTER — Ambulatory Visit (HOSPITAL_COMMUNITY)
Admission: RE | Admit: 2014-02-19 | Discharge: 2014-02-19 | Disposition: A | Discharge status: Home / Self Care | Payer: Medicare Other | Source: Ambulatory Visit | Attending: General Surgery | Admitting: General Surgery

## 2014-02-19 ENCOUNTER — Encounter (HOSPITAL_COMMUNITY): Payer: Self-pay | Admitting: *Deleted

## 2014-02-19 ENCOUNTER — Encounter (HOSPITAL_COMMUNITY)
Admission: RE | Disposition: A | Discharge status: Home / Self Care | Payer: Self-pay | Source: Ambulatory Visit | Attending: General Surgery

## 2014-02-19 ENCOUNTER — Encounter (HOSPITAL_COMMUNITY): Payer: Medicare Other | Admitting: Anesthesiology

## 2014-02-19 ENCOUNTER — Ambulatory Visit (HOSPITAL_COMMUNITY): Payer: Medicare Other | Admitting: Anesthesiology

## 2014-02-19 DIAGNOSIS — K219 Gastro-esophageal reflux disease without esophagitis: Secondary | ICD-10-CM | POA: Insufficient documentation

## 2014-02-19 DIAGNOSIS — K4031 Unilateral inguinal hernia, with obstruction, without gangrene, recurrent: Secondary | ICD-10-CM | POA: Insufficient documentation

## 2014-02-19 DIAGNOSIS — I10 Essential (primary) hypertension: Secondary | ICD-10-CM | POA: Diagnosis not present

## 2014-02-19 DIAGNOSIS — Z79899 Other long term (current) drug therapy: Secondary | ICD-10-CM | POA: Insufficient documentation

## 2014-02-19 DIAGNOSIS — K4090 Unilateral inguinal hernia, without obstruction or gangrene, not specified as recurrent: Secondary | ICD-10-CM | POA: Diagnosis not present

## 2014-02-19 DIAGNOSIS — Z7982 Long term (current) use of aspirin: Secondary | ICD-10-CM | POA: Insufficient documentation

## 2014-02-19 DIAGNOSIS — H409 Unspecified glaucoma: Secondary | ICD-10-CM | POA: Insufficient documentation

## 2014-02-19 DIAGNOSIS — M129 Arthropathy, unspecified: Secondary | ICD-10-CM | POA: Insufficient documentation

## 2014-02-19 DIAGNOSIS — K4091 Unilateral inguinal hernia, without obstruction or gangrene, recurrent: Secondary | ICD-10-CM | POA: Diagnosis not present

## 2014-02-19 DIAGNOSIS — D649 Anemia, unspecified: Secondary | ICD-10-CM | POA: Diagnosis not present

## 2014-02-19 HISTORY — PX: INSERTION OF MESH: SHX5868

## 2014-02-19 HISTORY — PX: INGUINAL HERNIA REPAIR: SHX194

## 2014-02-19 SURGERY — REPAIR, HERNIA, INGUINAL, ADULT
Anesthesia: Monitor Anesthesia Care | Site: Groin | Laterality: Left

## 2014-02-19 MED ORDER — PROPOFOL 10 MG/ML IV EMUL
INTRAVENOUS | Status: AC
  Filled 2014-02-19: qty 20

## 2014-02-19 MED ORDER — SODIUM CHLORIDE 0.9 % IR SOLN
Status: DC | PRN
  Administered 2014-02-19: 1000 mL

## 2014-02-19 MED ORDER — FENTANYL CITRATE 0.05 MG/ML IJ SOLN
25.0000 ug | INTRAMUSCULAR | Status: AC
  Administered 2014-02-19: 25 ug via INTRAVENOUS

## 2014-02-19 MED ORDER — FENTANYL CITRATE 0.05 MG/ML IJ SOLN
25.0000 ug | INTRAMUSCULAR | Status: DC | PRN
Start: 2014-02-19 — End: 2014-02-19

## 2014-02-19 MED ORDER — POVIDONE-IODINE 10 % EX OINT
TOPICAL_OINTMENT | CUTANEOUS | Status: AC
  Filled 2014-02-19: qty 1

## 2014-02-19 MED ORDER — PROPOFOL INFUSION 10 MG/ML OPTIME
INTRAVENOUS | Status: DC | PRN
  Administered 2014-02-19: 75 ug/kg/min via INTRAVENOUS

## 2014-02-19 MED ORDER — FENTANYL CITRATE 0.05 MG/ML IJ SOLN
INTRAMUSCULAR | Status: DC | PRN
  Administered 2014-02-19 (×3): 12.5 ug via INTRAVENOUS

## 2014-02-19 MED ORDER — KETOROLAC TROMETHAMINE 30 MG/ML IJ SOLN
30.0000 mg | Freq: Once | INTRAMUSCULAR | Status: AC
  Administered 2014-02-19: 30 mg via INTRAVENOUS
  Filled 2014-02-19: qty 1

## 2014-02-19 MED ORDER — CEFAZOLIN SODIUM-DEXTROSE 2-3 GM-% IV SOLR
2.0000 g | INTRAVENOUS | Status: AC
Start: 2014-02-19 — End: 2014-02-19
  Administered 2014-02-19: 2 g via INTRAVENOUS
  Filled 2014-02-19: qty 50

## 2014-02-19 MED ORDER — BUPIVACAINE LIPOSOME 1.3 % IJ SUSP
INTRAMUSCULAR | Status: DC | PRN
  Administered 2014-02-19: 10 mL

## 2014-02-19 MED ORDER — EPHEDRINE SULFATE 50 MG/ML IJ SOLN
INTRAMUSCULAR | Status: AC
  Filled 2014-02-19: qty 1

## 2014-02-19 MED ORDER — FENTANYL CITRATE 0.05 MG/ML IJ SOLN
INTRAMUSCULAR | Status: DC | PRN
  Administered 2014-02-19: 12.5 ug via INTRATHECAL

## 2014-02-19 MED ORDER — FENTANYL CITRATE 0.05 MG/ML IJ SOLN
INTRAMUSCULAR | Status: AC
  Filled 2014-02-19: qty 2

## 2014-02-19 MED ORDER — CHLORHEXIDINE GLUCONATE 4 % EX LIQD: 1.0000 "application " | Freq: Once | CUTANEOUS | Status: DC

## 2014-02-19 MED ORDER — EPHEDRINE SULFATE 50 MG/ML IJ SOLN
INTRAMUSCULAR | Status: DC | PRN
  Administered 2014-02-19 (×3): 5 mg via INTRAVENOUS

## 2014-02-19 MED ORDER — ONDANSETRON HCL 4 MG/2ML IJ SOLN: 4.0000 mg | Freq: Once | INTRAMUSCULAR | Status: DC | PRN

## 2014-02-19 MED ORDER — LIDOCAINE HCL (CARDIAC) 10 MG/ML IV SOLN
INTRAVENOUS | Status: DC | PRN
  Administered 2014-02-19: 10 mg via INTRAVENOUS

## 2014-02-19 MED ORDER — LIDOCAINE HCL (PF) 1 % IJ SOLN
INTRAMUSCULAR | Status: AC
  Filled 2014-02-19: qty 5

## 2014-02-19 MED ORDER — ONDANSETRON HCL 4 MG/2ML IJ SOLN
INTRAMUSCULAR | Status: AC
  Filled 2014-02-19: qty 2

## 2014-02-19 MED ORDER — SODIUM CHLORIDE BACTERIOSTATIC 0.9 % IJ SOLN
INTRAMUSCULAR | Status: AC
  Filled 2014-02-19: qty 10

## 2014-02-19 MED ORDER — ONDANSETRON HCL 4 MG/2ML IJ SOLN
4.0000 mg | Freq: Once | INTRAMUSCULAR | Status: AC
  Administered 2014-02-19: 4 mg via INTRAVENOUS

## 2014-02-19 MED ORDER — LIDOCAINE IN DEXTROSE 5-7.5 % IV SOLN
INTRAVENOUS | Status: DC | PRN
  Administered 2014-02-19: 75 mg via INTRATHECAL

## 2014-02-19 MED ORDER — BUPIVACAINE LIPOSOME 1.3 % IJ SUSP
20.0000 mL | Freq: Once | INTRAMUSCULAR | Status: DC
  Filled 2014-02-19: qty 20

## 2014-02-19 MED ORDER — LACTATED RINGERS IV SOLN
INTRAVENOUS | Status: DC
  Administered 2014-02-19: 10:00:00 via INTRAVENOUS

## 2014-02-19 MED ORDER — MIDAZOLAM HCL 2 MG/2ML IJ SOLN
INTRAMUSCULAR | Status: AC
  Filled 2014-02-19: qty 2

## 2014-02-19 MED ORDER — MIDAZOLAM HCL 2 MG/2ML IJ SOLN
1.0000 mg | INTRAMUSCULAR | Status: DC | PRN
  Administered 2014-02-19: 2 mg via INTRAVENOUS

## 2014-02-19 MED ORDER — HYDROCODONE-ACETAMINOPHEN 5-325 MG PO TABS: 1.0000 | ORAL_TABLET | Freq: Four times a day (QID) | ORAL | Status: AC | PRN

## 2014-02-19 SURGICAL SUPPLY — 37 items
ADH SKN CLS APL DERMABOND .7 (GAUZE/BANDAGES/DRESSINGS) ×1
BAG HAMPER (MISCELLANEOUS) ×3 IMPLANT
CLOTH BEACON ORANGE TIMEOUT ST (SAFETY) ×3 IMPLANT
COVER LIGHT HANDLE STERIS (MISCELLANEOUS) ×6 IMPLANT
DECANTER SPIKE VIAL GLASS SM (MISCELLANEOUS) ×3 IMPLANT
DERMABOND ADVANCED (GAUZE/BANDAGES/DRESSINGS) ×2
DERMABOND ADVANCED .7 DNX12 (GAUZE/BANDAGES/DRESSINGS) ×1 IMPLANT
DRAIN PENROSE 18X1/2 LTX STRL (DRAIN) ×3 IMPLANT
ELECT REM PT RETURN 9FT ADLT (ELECTROSURGICAL) ×3
ELECTRODE REM PT RTRN 9FT ADLT (ELECTROSURGICAL) ×1 IMPLANT
FORMALIN 10 PREFIL 120ML (MISCELLANEOUS) IMPLANT
GLOVE BIOGEL PI IND STRL 7.0 (GLOVE) IMPLANT
GLOVE BIOGEL PI INDICATOR 7.0 (GLOVE) ×6
GLOVE ECLIPSE 7.0 STRL STRAW (GLOVE) ×3 IMPLANT
GLOVE SURG SS PI 7.5 STRL IVOR (GLOVE) ×5 IMPLANT
GOWN STRL REUS W/TWL LRG LVL3 (GOWN DISPOSABLE) ×9 IMPLANT
INST SET MINOR GENERAL (KITS) ×3 IMPLANT
KIT ROOM TURNOVER APOR (KITS) ×3 IMPLANT
MANIFOLD NEPTUNE II (INSTRUMENTS) ×3 IMPLANT
MESH MARLEX PLUG MEDIUM (Mesh General) ×2 IMPLANT
NS IRRIG 1000ML POUR BTL (IV SOLUTION) ×3 IMPLANT
PACK MINOR (CUSTOM PROCEDURE TRAY) ×3 IMPLANT
PAD ARMBOARD 7.5X6 YLW CONV (MISCELLANEOUS) ×3 IMPLANT
SET BASIN LINEN APH (SET/KITS/TRAYS/PACK) ×3 IMPLANT
SOL PREP PROV IODINE SCRUB 4OZ (MISCELLANEOUS) ×3 IMPLANT
SUT NOVA NAB GS-22 2 2-0 T-19 (SUTURE) ×4 IMPLANT
SUT NOVAFIL NAB HGS22 2-0 30IN (SUTURE) IMPLANT
SUT SILK 3 0 (SUTURE)
SUT SILK 3-0 18XBRD TIE 12 (SUTURE) IMPLANT
SUT VIC AB 2-0 CT1 27 (SUTURE) ×3
SUT VIC AB 2-0 CT1 TAPERPNT 27 (SUTURE) ×1 IMPLANT
SUT VIC AB 3-0 SH 27 (SUTURE) ×3
SUT VIC AB 3-0 SH 27X BRD (SUTURE) ×1 IMPLANT
SUT VIC AB 4-0 PS2 27 (SUTURE) ×3 IMPLANT
SUT VICRYL AB 3 0 TIES (SUTURE) IMPLANT
SYR 20CC LL (SYRINGE) ×3 IMPLANT
TOWEL OR 17X26 4PK STRL BLUE (TOWEL DISPOSABLE) ×1 IMPLANT

## 2014-02-19 NOTE — Op Note (Signed)
Patient:  Kathy Ford  DOB:  [birth year]  MRN:  [id number]   Preop Diagnosis:  Recurrent left inguinal hernia  Postop Diagnosis:  Same  Procedure:  Recurrent left inguinal herniorrhaphy with mesh  Surgeon:  Aviva Signs, M.D.  Anes:  Spinal  Indications:  Patient is a 80 year old white female who had an episode of incarceration of a left inguinal hernia, treated in the emergency room. She has a history of having left renal herniorrhaphy in the remote past. Patient now presents for left recurrent inguinal herniorrhaphy with mesh. The risks and benefits of the procedure including bleeding, infection, and recurrence the hernia were fully explained to the patient, who gave informed consent.  Procedure note:  The patient is placed the supine position. After induction of general endotracheal anesthesia, the abdomen was prepped and draped using usual sterile technique with DuraPrep. Surgical site confirmation was performed.  A left groin incision was made down to the external oblique aponeuroses. It appeared that the patient had incarcerated properitoneal fat in the hernia sac just medial to the pubic tubercle. The hernia sac along with some incarcerated properitoneal fat and omental contents was excised and disposed of. Once the rest of the contents were reduced, a medium size Bard PerFix plug was then inserted and secured using 20 Novafil interrupted sutures. The range overlying the hernia repair was then reapproximated using 2-0 Novafil interrupted sutures. The subcutaneous layer was reapproximated using 3-0 Vicryl interrupted sutures. The skin was closed using a 4-0 Vicryl subcuticular suture. Exparel was instilled the surrounding wound. Dermabond was then applied.  All tape and needle counts were correct the end of the procedure. Patient was transferred to PACU in stable condition.  Complications:  None  EBL:  Minimal  Specimen:  None

## 2014-02-19 NOTE — Interval H&P Note (Signed)
History and Physical Interval Note:  02/19/2014 10:28 AM  Kathy Ford  has presented today for surgery, with the diagnosis of recurrent left inguinal hernia  The various methods of treatment have been discussed with the patient and family. After consideration of risks, benefits and other options for treatment, the patient has consented to  Procedure(s): HERNIA REPAIR INGUINAL ADULT (Left) as a surgical intervention .  The patient's history has been reviewed, patient examined, no change in status, stable for surgery.  I have reviewed the patient's chart and labs.  Questions were answered to the patient's satisfaction.     Jamesetta So

## 2014-02-19 NOTE — Discharge Instructions (Signed)
Inguinal Hernia, Adult  °Care After °Refer to this sheet in the next few weeks. These discharge instructions provide you with general information on caring for yourself after you leave the hospital. Your caregiver may also give you specific instructions. Your treatment has been planned according to the most current medical practices available, but unavoidable complications sometimes occur. If you have any problems or questions after discharge, please call your caregiver. °HOME CARE INSTRUCTIONS °· Put ice on the operative site. °· Put ice in a plastic bag. °· Place a towel between your skin and the bag. °· Leave the ice on for 15-20 minutes at a time, 03-04 times a day while awake. °· Change bandages (dressings) as directed. °· Keep the wound dry and clean. The wound may be washed gently with soap and water. Gently blot or dab the wound dry. It is okay to take showers 24 to 48 hours after surgery. Do not take baths, use swimming pools, or use hot tubs for 10 days, or as directed by your caregiver. °· Only take over-the-counter or prescription medicines for pain, discomfort, or fever as directed by your caregiver. °· Continue your normal diet as directed. °· Do not lift anything more than 10 pounds or play contact sports for 3 weeks, or as directed. °SEEK MEDICAL CARE IF: °· There is redness, swelling, or increasing pain in the wound. °· There is fluid (pus) coming from the wound. °· There is drainage from a wound lasting longer than 1 day. °· You have an oral temperature above 102° F (38.9° C). °· You notice a bad smell coming from the wound or dressing. °· The wound breaks open after the stitches (sutures) have been removed. °· You notice increasing pain in the shoulders (shoulder strap areas). °· You develop dizzy episodes or fainting while standing. °· You feel sick to your stomach (nauseous) or throw up (vomit). °SEEK IMMEDIATE MEDICAL CARE IF: °· You develop a rash. °· You have difficulty breathing. °· You  develop a reaction or have side effects to medicines you were given. °MAKE SURE YOU:  °· Understand these instructions. °· Will watch your condition. °· Will get help right away if you are not doing well or get worse. °Document Released: 10/25/2006 Document Revised: 12/17/2011 Document Reviewed: 08/24/2009 °[employer]® Patient Information ©2014 [employer]. ° °

## 2014-02-19 NOTE — Anesthesia Procedure Notes (Signed)
Procedure Name: MAC Date/Time: 02/19/2014 10:45 AM Performed by: Vista Deck Pre-anesthesia Checklist: Patient identified, Emergency Drugs available, Suction available, Timeout performed and Patient being monitored Patient Re-evaluated:Patient Re-evaluated prior to inductionOxygen Delivery Method: Non-rebreather mask    Spinal  Patient location during procedure: OR Start time: 02/19/2014 10:50 AM End time: 02/19/2014 10:55 AM Staffing CRNA/Resident: Drucie Opitz S Preanesthetic Checklist Completed: patient identified, site marked, surgical consent, pre-op evaluation, timeout performed, IV checked, risks and benefits discussed and monitors and equipment checked Spinal Block Patient position: left lateral decubitus Prep: Betadine Patient monitoring: heart rate, cardiac monitor, continuous pulse ox and blood pressure Approach: left paramedian Location: L4-5 Injection technique: single-shot Needle Needle type: Spinocan  Needle gauge: 22 G Needle length: 9 cm Assessment Sensory level: T6 Additional Notes Betadine prep x3 1% lidocaine skinwheal  Clear CSF pre and post injection  ATTEMPTS: 1 TRAY ID: [id number] TRAY EXPIRATION DATE: 2016-06

## 2014-02-19 NOTE — Transfer of Care (Signed)
Immediate Anesthesia Transfer of Care Note  Patient: Kathy Ford  Procedure(s) Performed: Procedure(s) (LRB): HERNIA REPAIR INGUINAL ADULT (Left) INSERTION OF MESH (Left)  Patient Location: PACU  Anesthesia Type: SAB  Level of Consciousness: awake  Airway & Oxygen Therapy: Patient Spontanous Breathing   Post-op Assessment: Report given to PACU RN, Post -op Vital signs reviewed and stable. SAB Level  T 10  Post vital signs: Reviewed and stable  Complications: No apparent anesthesia complications

## 2014-02-19 NOTE — Anesthesia Preprocedure Evaluation (Signed)
Anesthesia Evaluation  Patient identified by MRN, date of birth, ID band Patient awake    Reviewed: Allergy & Precautions, H&P , NPO status , Patient's Chart, lab work & pertinent test results, reviewed documented beta blocker date and time   History of Anesthesia Complications (+) PONV and history of anesthetic complications  Airway Mallampati: II TM Distance: >3 FB     Dental  (+) Partial Upper   Pulmonary  breath sounds clear to auscultation        Cardiovascular hypertension, Pt. on medications and Pt. on home beta blockers Rhythm:Regular Rate:Normal     Neuro/Psych    GI/Hepatic GERD-  Medicated and Controlled,  Endo/Other    Renal/GU      Musculoskeletal   Abdominal   Peds  Hematology  (+) anemia ,   Anesthesia Other Findings   Reproductive/Obstetrics                           Anesthesia Physical Anesthesia Plan  ASA: III  Anesthesia Plan: Spinal and MAC   Post-op Pain Management:    Induction: Intravenous  Airway Management Planned: Nasal Cannula  Additional Equipment:   Intra-op Plan:   Post-operative Plan:   Informed Consent: I have reviewed the patients History and Physical, chart, labs and discussed the procedure including the risks, benefits and alternatives for the proposed anesthesia with the patient or authorized representative who has indicated his/her understanding and acceptance.     Plan Discussed with:   Anesthesia Plan Comments: (Lidocaine "allergy" was a reaction to the epinephrine at the dentist.)        Anesthesia Quick Evaluation

## 2014-02-19 NOTE — Anesthesia Postprocedure Evaluation (Signed)
  Anesthesia Post-op Note  Patient: Kathy Ford  Procedure(s) Performed: Procedure(s): HERNIA REPAIR INGUINAL ADULT (Left) INSERTION OF MESH (Left)  Patient Location: PACU  Anesthesia Type:Spinal  Level of Consciousness: awake and patient cooperative  Airway and Oxygen Therapy: Patient Spontanous Breathing  Post-op Pain: none  Post-op Assessment: Post-op Vital signs reviewed, Patient's Cardiovascular Status Stable, Respiratory Function Stable, Patent Airway and No signs of Nausea or vomiting  Post-op Vital Signs: Reviewed and stable  Last Vitals:  Filed Vitals:   02/19/14 1152  BP: 116/52  Pulse: 67  Temp: 36.4 C  Resp: 16    Complications: No apparent anesthesia complications

## 2014-02-22 ENCOUNTER — Encounter (HOSPITAL_COMMUNITY): Payer: Self-pay | Admitting: General Surgery

## 2014-04-06 DIAGNOSIS — L570 Actinic keratosis: Secondary | ICD-10-CM | POA: Diagnosis not present

## 2014-04-19 ENCOUNTER — Encounter (INDEPENDENT_AMBULATORY_CARE_PROVIDER_SITE_OTHER): Payer: Self-pay | Admitting: Internal Medicine

## 2014-04-19 ENCOUNTER — Ambulatory Visit (INDEPENDENT_AMBULATORY_CARE_PROVIDER_SITE_OTHER): Payer: Medicare Other | Admitting: Internal Medicine

## 2014-04-19 VITALS — BP 130/82 | HR 76 | Temp 98.0°F | Resp 18 | Ht 62.0 in | Wt 143.3 lb

## 2014-04-19 DIAGNOSIS — K219 Gastro-esophageal reflux disease without esophagitis: Secondary | ICD-10-CM

## 2014-04-19 NOTE — Patient Instructions (Addendum)
Call if Nexium stops working; Please discuss with Dr. Luan Pulling if you should have bone density study

## 2014-04-19 NOTE — Progress Notes (Signed)
Presenting complaint;  Followup for GERD.  Subjective:  Patient is an 80 year old Caucasian female who has chronic GERD who presents for scheduled visit. Her last visit was in February 2014. She continues to feel well. She's been off sucralfate for several months and has not felt need to go back on it. She rarely experiences heartburn. Every now and then she may have feeling of lump in her throat but it's self-limiting. She has sporadic midepigastric pain which she states she's had for 7 years and is always relieved when taking a few sips of water or other liquids. She denies dysphagia. She is not having any side effects with Nexium. Her bowels move regularly. She denies melena or rectal bleeding. She says her last bone density study was 2 years ago and reveal osteopenia involving forearm bones. She remains very active. She walks and swims regularly.  Current Medications: Outpatient Encounter Prescriptions as of 04/19/2014  Medication Sig  . amLODipine (NORVASC) 5 MG tablet Take 5 mg by mouth daily.  Marland Kitchen aspirin 81 MG tablet Take 81 mg by mouth daily.    . bisoprolol-hydrochlorothiazide (ZIAC) 10-6.25 MG per tablet Take 1 tablet by mouth daily.    . brimonidine (ALPHAGAN) 0.15 % ophthalmic solution Place 1 drop into both eyes 3 (three) times daily.   . Calcium Carbonate (CALTRATE 600 PO) Take 1 tablet by mouth daily.  Marland Kitchen esomeprazole (NEXIUM) 40 MG capsule Take 40 mg by mouth daily before breakfast.  . IPRATROPIUM BROMIDE NA Place 1 spray into the nose as needed (To dry up mucus in nasal area.).   Marland Kitchen latanoprost (XALATAN) 0.005 % ophthalmic solution Place 1 drop into both eyes daily.   Marland Kitchen losartan-hydrochlorothiazide (HYZAAR) 100-25 MG per tablet Take 1 tablet by mouth daily.  . Multiple Vitamin (MULTIVITAMIN) tablet Take 1 tablet by mouth daily.    . potassium chloride SA (K-DUR,KLOR-CON) 20 MEQ tablet Take 20 mEq by mouth 3 (three) times daily.    Marland Kitchen HYDROcodone-acetaminophen (NORCO/VICODIN)  5-325 MG per tablet Take 1 tablet by mouth every 6 (six) hours as needed for moderate pain.     Objective: Blood pressure 130/82, pulse 76, temperature 98 F (36.7 C), temperature source Oral, resp. rate 18, height 5\' 2"  (1.575 m), weight 143 lb 4.8 oz (65 kg). Patient is alert and in no acute distress. Conjunctiva is pink. Sclera is nonicteric Oropharyngeal mucosa is normal. No neck masses or thyromegaly noted. Cardiac exam with regular rhythm normal S1 and S2. No murmur or gallop noted. Lungs are clear to auscultation. Abdomen symmetrical soft and nontender. No organomegaly or masses. No LE edema or clubbing noted.    Assessment:  #1. Chronic GERD. She is doing very well with therapy except she has sporadic epigastric pain relieved with taking a few sips of liquids. Suspect this may be secondary to GERD. At some point may consider dropping Nexium dose. Last EGD was in July 2013 and was negative for Barrett's. #2. Patient is average risk for CRC and up-to-date on screening. Last exam was in March 2014  Plan:  Continue anti-reflux measures and Nexium at current dose which is 40 mg by mouth every morning. She will talk with Dr. Luan Pulling about getting bone density study. If she is noted to have osteoporosis would consider backing of a Nexium dose. Office visit in one year.

## 2014-04-21 DIAGNOSIS — M47814 Spondylosis without myelopathy or radiculopathy, thoracic region: Secondary | ICD-10-CM | POA: Diagnosis not present

## 2014-04-21 DIAGNOSIS — M4712 Other spondylosis with myelopathy, cervical region: Secondary | ICD-10-CM | POA: Diagnosis not present

## 2014-04-21 DIAGNOSIS — M9981 Other biomechanical lesions of cervical region: Secondary | ICD-10-CM | POA: Diagnosis not present

## 2014-04-21 DIAGNOSIS — M4000 Postural kyphosis, site unspecified: Secondary | ICD-10-CM | POA: Diagnosis not present

## 2014-04-21 DIAGNOSIS — M531 Cervicobrachial syndrome: Secondary | ICD-10-CM | POA: Diagnosis not present

## 2014-04-21 DIAGNOSIS — M999 Biomechanical lesion, unspecified: Secondary | ICD-10-CM | POA: Diagnosis not present

## 2014-04-23 DIAGNOSIS — M4000 Postural kyphosis, site unspecified: Secondary | ICD-10-CM | POA: Diagnosis not present

## 2014-04-23 DIAGNOSIS — M4712 Other spondylosis with myelopathy, cervical region: Secondary | ICD-10-CM | POA: Diagnosis not present

## 2014-04-23 DIAGNOSIS — M47814 Spondylosis without myelopathy or radiculopathy, thoracic region: Secondary | ICD-10-CM | POA: Diagnosis not present

## 2014-04-23 DIAGNOSIS — M999 Biomechanical lesion, unspecified: Secondary | ICD-10-CM | POA: Diagnosis not present

## 2014-04-23 DIAGNOSIS — M531 Cervicobrachial syndrome: Secondary | ICD-10-CM | POA: Diagnosis not present

## 2014-04-23 DIAGNOSIS — M9981 Other biomechanical lesions of cervical region: Secondary | ICD-10-CM | POA: Diagnosis not present

## 2014-04-26 DIAGNOSIS — M47814 Spondylosis without myelopathy or radiculopathy, thoracic region: Secondary | ICD-10-CM | POA: Diagnosis not present

## 2014-04-26 DIAGNOSIS — M9981 Other biomechanical lesions of cervical region: Secondary | ICD-10-CM | POA: Diagnosis not present

## 2014-04-26 DIAGNOSIS — M531 Cervicobrachial syndrome: Secondary | ICD-10-CM | POA: Diagnosis not present

## 2014-04-26 DIAGNOSIS — M4712 Other spondylosis with myelopathy, cervical region: Secondary | ICD-10-CM | POA: Diagnosis not present

## 2014-04-26 DIAGNOSIS — M4000 Postural kyphosis, site unspecified: Secondary | ICD-10-CM | POA: Diagnosis not present

## 2014-04-26 DIAGNOSIS — M999 Biomechanical lesion, unspecified: Secondary | ICD-10-CM | POA: Diagnosis not present

## 2014-04-28 DIAGNOSIS — M4000 Postural kyphosis, site unspecified: Secondary | ICD-10-CM | POA: Diagnosis not present

## 2014-04-28 DIAGNOSIS — M999 Biomechanical lesion, unspecified: Secondary | ICD-10-CM | POA: Diagnosis not present

## 2014-04-28 DIAGNOSIS — M47814 Spondylosis without myelopathy or radiculopathy, thoracic region: Secondary | ICD-10-CM | POA: Diagnosis not present

## 2014-04-28 DIAGNOSIS — M4712 Other spondylosis with myelopathy, cervical region: Secondary | ICD-10-CM | POA: Diagnosis not present

## 2014-04-28 DIAGNOSIS — M9981 Other biomechanical lesions of cervical region: Secondary | ICD-10-CM | POA: Diagnosis not present

## 2014-04-28 DIAGNOSIS — M531 Cervicobrachial syndrome: Secondary | ICD-10-CM | POA: Diagnosis not present

## 2014-05-03 DIAGNOSIS — M4000 Postural kyphosis, site unspecified: Secondary | ICD-10-CM | POA: Diagnosis not present

## 2014-05-03 DIAGNOSIS — M47814 Spondylosis without myelopathy or radiculopathy, thoracic region: Secondary | ICD-10-CM | POA: Diagnosis not present

## 2014-05-03 DIAGNOSIS — M9981 Other biomechanical lesions of cervical region: Secondary | ICD-10-CM | POA: Diagnosis not present

## 2014-05-03 DIAGNOSIS — M999 Biomechanical lesion, unspecified: Secondary | ICD-10-CM | POA: Diagnosis not present

## 2014-05-03 DIAGNOSIS — M531 Cervicobrachial syndrome: Secondary | ICD-10-CM | POA: Diagnosis not present

## 2014-05-03 DIAGNOSIS — M4712 Other spondylosis with myelopathy, cervical region: Secondary | ICD-10-CM | POA: Diagnosis not present

## 2014-05-06 DIAGNOSIS — M531 Cervicobrachial syndrome: Secondary | ICD-10-CM | POA: Diagnosis not present

## 2014-05-06 DIAGNOSIS — M4712 Other spondylosis with myelopathy, cervical region: Secondary | ICD-10-CM | POA: Diagnosis not present

## 2014-05-06 DIAGNOSIS — M9981 Other biomechanical lesions of cervical region: Secondary | ICD-10-CM | POA: Diagnosis not present

## 2014-05-06 DIAGNOSIS — M999 Biomechanical lesion, unspecified: Secondary | ICD-10-CM | POA: Diagnosis not present

## 2014-05-06 DIAGNOSIS — M4000 Postural kyphosis, site unspecified: Secondary | ICD-10-CM | POA: Diagnosis not present

## 2014-05-06 DIAGNOSIS — M47814 Spondylosis without myelopathy or radiculopathy, thoracic region: Secondary | ICD-10-CM | POA: Diagnosis not present

## 2014-05-12 ENCOUNTER — Other Ambulatory Visit (HOSPITAL_COMMUNITY): Payer: Self-pay | Admitting: Pulmonary Disease

## 2014-05-12 DIAGNOSIS — M1990 Unspecified osteoarthritis, unspecified site: Secondary | ICD-10-CM

## 2014-05-12 DIAGNOSIS — I10 Essential (primary) hypertension: Secondary | ICD-10-CM | POA: Diagnosis not present

## 2014-05-12 DIAGNOSIS — Z780 Asymptomatic menopausal state: Secondary | ICD-10-CM

## 2014-05-12 DIAGNOSIS — K2100 Gastro-esophageal reflux disease with esophagitis, without bleeding: Secondary | ICD-10-CM | POA: Diagnosis not present

## 2014-05-17 DIAGNOSIS — M531 Cervicobrachial syndrome: Secondary | ICD-10-CM | POA: Diagnosis not present

## 2014-05-17 DIAGNOSIS — M47814 Spondylosis without myelopathy or radiculopathy, thoracic region: Secondary | ICD-10-CM | POA: Diagnosis not present

## 2014-05-17 DIAGNOSIS — M999 Biomechanical lesion, unspecified: Secondary | ICD-10-CM | POA: Diagnosis not present

## 2014-05-17 DIAGNOSIS — M4000 Postural kyphosis, site unspecified: Secondary | ICD-10-CM | POA: Diagnosis not present

## 2014-05-17 DIAGNOSIS — M9981 Other biomechanical lesions of cervical region: Secondary | ICD-10-CM | POA: Diagnosis not present

## 2014-05-17 DIAGNOSIS — M4712 Other spondylosis with myelopathy, cervical region: Secondary | ICD-10-CM | POA: Diagnosis not present

## 2014-05-19 ENCOUNTER — Other Ambulatory Visit (HOSPITAL_COMMUNITY): Payer: Medicare Other

## 2014-05-26 ENCOUNTER — Encounter (INDEPENDENT_AMBULATORY_CARE_PROVIDER_SITE_OTHER): Payer: Self-pay

## 2014-05-26 ENCOUNTER — Ambulatory Visit (HOSPITAL_COMMUNITY)
Admission: RE | Admit: 2014-05-26 | Discharge: 2014-05-26 | Disposition: A | Discharge status: Home / Self Care | Payer: Medicare Other | Source: Ambulatory Visit | Attending: Pulmonary Disease | Admitting: Pulmonary Disease

## 2014-05-26 DIAGNOSIS — M810 Age-related osteoporosis without current pathological fracture: Secondary | ICD-10-CM | POA: Diagnosis not present

## 2014-05-26 DIAGNOSIS — Z780 Asymptomatic menopausal state: Secondary | ICD-10-CM | POA: Diagnosis not present

## 2014-05-26 LAB — HM DEXA SCAN

## 2014-05-31 DIAGNOSIS — M999 Biomechanical lesion, unspecified: Secondary | ICD-10-CM | POA: Diagnosis not present

## 2014-05-31 DIAGNOSIS — M4000 Postural kyphosis, site unspecified: Secondary | ICD-10-CM | POA: Diagnosis not present

## 2014-05-31 DIAGNOSIS — M4712 Other spondylosis with myelopathy, cervical region: Secondary | ICD-10-CM | POA: Diagnosis not present

## 2014-05-31 DIAGNOSIS — M9981 Other biomechanical lesions of cervical region: Secondary | ICD-10-CM | POA: Diagnosis not present

## 2014-05-31 DIAGNOSIS — M531 Cervicobrachial syndrome: Secondary | ICD-10-CM | POA: Diagnosis not present

## 2014-05-31 DIAGNOSIS — M47814 Spondylosis without myelopathy or radiculopathy, thoracic region: Secondary | ICD-10-CM | POA: Diagnosis not present

## 2014-06-25 LAB — HM DEXA SCAN

## 2014-07-13 DIAGNOSIS — M4004 Postural kyphosis, thoracic region: Secondary | ICD-10-CM | POA: Diagnosis not present

## 2014-07-13 DIAGNOSIS — M47812 Spondylosis without myelopathy or radiculopathy, cervical region: Secondary | ICD-10-CM | POA: Diagnosis not present

## 2014-07-13 DIAGNOSIS — M9902 Segmental and somatic dysfunction of thoracic region: Secondary | ICD-10-CM | POA: Diagnosis not present

## 2014-07-13 DIAGNOSIS — M9903 Segmental and somatic dysfunction of lumbar region: Secondary | ICD-10-CM | POA: Diagnosis not present

## 2014-07-13 DIAGNOSIS — M9901 Segmental and somatic dysfunction of cervical region: Secondary | ICD-10-CM | POA: Diagnosis not present

## 2014-08-10 DIAGNOSIS — M4004 Postural kyphosis, thoracic region: Secondary | ICD-10-CM | POA: Diagnosis not present

## 2014-08-10 DIAGNOSIS — M9902 Segmental and somatic dysfunction of thoracic region: Secondary | ICD-10-CM | POA: Diagnosis not present

## 2014-08-10 DIAGNOSIS — M9901 Segmental and somatic dysfunction of cervical region: Secondary | ICD-10-CM | POA: Diagnosis not present

## 2014-08-10 DIAGNOSIS — M9903 Segmental and somatic dysfunction of lumbar region: Secondary | ICD-10-CM | POA: Diagnosis not present

## 2014-08-10 DIAGNOSIS — M47812 Spondylosis without myelopathy or radiculopathy, cervical region: Secondary | ICD-10-CM | POA: Diagnosis not present

## 2014-09-15 DIAGNOSIS — Z23 Encounter for immunization: Secondary | ICD-10-CM | POA: Diagnosis not present

## 2014-09-17 DIAGNOSIS — H4011X2 Primary open-angle glaucoma, moderate stage: Secondary | ICD-10-CM | POA: Diagnosis not present

## 2014-12-02 DIAGNOSIS — M9903 Segmental and somatic dysfunction of lumbar region: Secondary | ICD-10-CM | POA: Diagnosis not present

## 2014-12-02 DIAGNOSIS — M9901 Segmental and somatic dysfunction of cervical region: Secondary | ICD-10-CM | POA: Diagnosis not present

## 2014-12-02 DIAGNOSIS — M4004 Postural kyphosis, thoracic region: Secondary | ICD-10-CM | POA: Diagnosis not present

## 2014-12-02 DIAGNOSIS — M9902 Segmental and somatic dysfunction of thoracic region: Secondary | ICD-10-CM | POA: Diagnosis not present

## 2014-12-02 DIAGNOSIS — M47812 Spondylosis without myelopathy or radiculopathy, cervical region: Secondary | ICD-10-CM | POA: Diagnosis not present

## 2014-12-17 DIAGNOSIS — H4011X2 Primary open-angle glaucoma, moderate stage: Secondary | ICD-10-CM | POA: Diagnosis not present

## 2014-12-17 DIAGNOSIS — Z961 Presence of intraocular lens: Secondary | ICD-10-CM | POA: Diagnosis not present

## 2015-01-10 DIAGNOSIS — K210 Gastro-esophageal reflux disease with esophagitis: Secondary | ICD-10-CM | POA: Diagnosis not present

## 2015-01-10 DIAGNOSIS — M1990 Unspecified osteoarthritis, unspecified site: Secondary | ICD-10-CM | POA: Diagnosis not present

## 2015-01-10 DIAGNOSIS — I10 Essential (primary) hypertension: Secondary | ICD-10-CM | POA: Diagnosis not present

## 2015-01-26 ENCOUNTER — Encounter (INDEPENDENT_AMBULATORY_CARE_PROVIDER_SITE_OTHER): Payer: Self-pay | Admitting: *Deleted

## 2015-02-04 DIAGNOSIS — M47812 Spondylosis without myelopathy or radiculopathy, cervical region: Secondary | ICD-10-CM | POA: Diagnosis not present

## 2015-02-04 DIAGNOSIS — M9903 Segmental and somatic dysfunction of lumbar region: Secondary | ICD-10-CM | POA: Diagnosis not present

## 2015-02-04 DIAGNOSIS — M9902 Segmental and somatic dysfunction of thoracic region: Secondary | ICD-10-CM | POA: Diagnosis not present

## 2015-02-04 DIAGNOSIS — M9901 Segmental and somatic dysfunction of cervical region: Secondary | ICD-10-CM | POA: Diagnosis not present

## 2015-02-04 DIAGNOSIS — M47816 Spondylosis without myelopathy or radiculopathy, lumbar region: Secondary | ICD-10-CM | POA: Diagnosis not present

## 2015-02-04 DIAGNOSIS — M4004 Postural kyphosis, thoracic region: Secondary | ICD-10-CM | POA: Diagnosis not present

## 2015-02-16 DIAGNOSIS — L821 Other seborrheic keratosis: Secondary | ICD-10-CM | POA: Diagnosis not present

## 2015-02-16 DIAGNOSIS — L570 Actinic keratosis: Secondary | ICD-10-CM | POA: Diagnosis not present

## 2015-02-21 DIAGNOSIS — K210 Gastro-esophageal reflux disease with esophagitis: Secondary | ICD-10-CM | POA: Diagnosis not present

## 2015-02-21 DIAGNOSIS — I10 Essential (primary) hypertension: Secondary | ICD-10-CM | POA: Diagnosis not present

## 2015-02-21 DIAGNOSIS — M1990 Unspecified osteoarthritis, unspecified site: Secondary | ICD-10-CM | POA: Diagnosis not present

## 2015-02-21 DIAGNOSIS — E785 Hyperlipidemia, unspecified: Secondary | ICD-10-CM | POA: Diagnosis not present

## 2015-03-25 DIAGNOSIS — H4011X2 Primary open-angle glaucoma, moderate stage: Secondary | ICD-10-CM | POA: Diagnosis not present

## 2015-03-31 DIAGNOSIS — M9903 Segmental and somatic dysfunction of lumbar region: Secondary | ICD-10-CM | POA: Diagnosis not present

## 2015-03-31 DIAGNOSIS — M4004 Postural kyphosis, thoracic region: Secondary | ICD-10-CM | POA: Diagnosis not present

## 2015-03-31 DIAGNOSIS — M47816 Spondylosis without myelopathy or radiculopathy, lumbar region: Secondary | ICD-10-CM | POA: Diagnosis not present

## 2015-03-31 DIAGNOSIS — M9901 Segmental and somatic dysfunction of cervical region: Secondary | ICD-10-CM | POA: Diagnosis not present

## 2015-03-31 DIAGNOSIS — M47812 Spondylosis without myelopathy or radiculopathy, cervical region: Secondary | ICD-10-CM | POA: Diagnosis not present

## 2015-03-31 DIAGNOSIS — M9902 Segmental and somatic dysfunction of thoracic region: Secondary | ICD-10-CM | POA: Diagnosis not present

## 2015-04-25 ENCOUNTER — Ambulatory Visit (INDEPENDENT_AMBULATORY_CARE_PROVIDER_SITE_OTHER): Payer: Medicare Other | Admitting: Internal Medicine

## 2015-05-10 DIAGNOSIS — M9902 Segmental and somatic dysfunction of thoracic region: Secondary | ICD-10-CM | POA: Diagnosis not present

## 2015-05-10 DIAGNOSIS — M4004 Postural kyphosis, thoracic region: Secondary | ICD-10-CM | POA: Diagnosis not present

## 2015-05-10 DIAGNOSIS — M47816 Spondylosis without myelopathy or radiculopathy, lumbar region: Secondary | ICD-10-CM | POA: Diagnosis not present

## 2015-05-10 DIAGNOSIS — M9903 Segmental and somatic dysfunction of lumbar region: Secondary | ICD-10-CM | POA: Diagnosis not present

## 2015-05-10 DIAGNOSIS — M47812 Spondylosis without myelopathy or radiculopathy, cervical region: Secondary | ICD-10-CM | POA: Diagnosis not present

## 2015-05-10 DIAGNOSIS — M9901 Segmental and somatic dysfunction of cervical region: Secondary | ICD-10-CM | POA: Diagnosis not present

## 2015-06-21 DIAGNOSIS — I10 Essential (primary) hypertension: Secondary | ICD-10-CM | POA: Diagnosis not present

## 2015-06-21 DIAGNOSIS — H4011X2 Primary open-angle glaucoma, moderate stage: Secondary | ICD-10-CM | POA: Diagnosis not present

## 2015-07-06 ENCOUNTER — Encounter (INDEPENDENT_AMBULATORY_CARE_PROVIDER_SITE_OTHER): Payer: Self-pay | Admitting: Internal Medicine

## 2015-07-06 ENCOUNTER — Ambulatory Visit (INDEPENDENT_AMBULATORY_CARE_PROVIDER_SITE_OTHER): Payer: Medicare Other | Admitting: Internal Medicine

## 2015-07-06 VITALS — BP 144/74 | HR 65 | Temp 97.4°F | Ht 61.5 in | Wt 141.4 lb

## 2015-07-06 DIAGNOSIS — K219 Gastro-esophageal reflux disease without esophagitis: Secondary | ICD-10-CM | POA: Diagnosis not present

## 2015-07-06 NOTE — Progress Notes (Signed)
Subjective:    Patient ID: Kathy Ford, female    DOB: 1929-08-19, 81 y.o.   MRN: [id number]  HPI Here today for f/u of her GERD. She was last seen in 2015 by Dr. Laural Golden.  She tells me she me she is doing good. Appetite is good. Nexium controls her GERD. No abdominal pain. Usually has a BM daily. No melena or BRRB. Her last EGD was in 2013. Her last colonoscopy was in 2014     04/23/2012 EGD: No evidence of erosive esophagitis or Barrett's esophagus.  Scant amount of bile noted in the esophagus suggesting alkaline reflux in addition to acid reflux which she has history of.  Small sliding hiatal hernia.  Abnormal appearance to antral mucosa most likely secondary to intestinal metaplasia. Biopsy taken to rule out other conditions.  Biopsy:  Gastric biopsy shows reactive changes and intestinal metaplasia. Suspect these changes are secondary to chronic due to no gastric bile reflux.  Saw Dr. Redmond Pulling for f/u after undergoing a laryngoscopy and everything was normal except she had a mucous pocket in her left sinus.    Review of Systems see hpi  12/20/2012: Colonoscopy Indications: Patient is 81 year old Caucasian female who is undergoing average risk for colonoscopy. Her last exam was 20 years ago. Impression:  Examination performed to cecum. Multiple diverticula at sigmoid colon with few more at descending and ascending colon. External hemorrhoids. Review of Systems Past Medical History  Diagnosis Date  . Hypertension   . Glaucoma   . GERD (gastroesophageal reflux disease)   . Arthritis   . Anemia   . PONV (postoperative nausea and vomiting)   . Incontinence of urine in female     Past Surgical History  Procedure Laterality Date  . Inguinal hernia repair  1998  . Laparoscopic cholecystectomy    . Total knee arthroplasty Bilateral     x 2 (both) one in 2010 and 2011  . Esophagogastroduodenoscopy  04/23/2012    Procedure: ESOPHAGOGASTRODUODENOSCOPY (EGD);   Surgeon: Rogene Houston, MD;  Location: [facility];  Service: Endoscopy;  Laterality: N/A;  1200  . Colonoscopy N/A 12/19/2012    Procedure: COLONOSCOPY;  Surgeon: Rogene Houston, MD;  Location: [facility];  Service: Endoscopy;  Laterality: N/A;  1030-moved to 1225 Ann to notify pt  . Breast biopsy      x2  . Joint replacement Bilateral   . Inguinal hernia repair Left 02/19/2014    Procedure: HERNIA REPAIR INGUINAL ADULT;  Surgeon: Jamesetta So, MD;  Location: [facility];  Service: General;  Laterality: Left;  . Insertion of mesh Left 02/19/2014    Procedure: INSERTION OF MESH;  Surgeon: Jamesetta So, MD;  Location: [facility];  Service: General;  Laterality: Left;    Allergies  Allergen Reactions  . Bee Venom Anaphylaxis and Swelling  . Caine-1 [Lidocaine] Other (See Comments)    novocaine-passed out; due to epinephrine added to novocaine.    Current Outpatient Prescriptions on File Prior to Visit  Medication Sig Dispense Refill  . amLODipine (NORVASC) 5 MG tablet Take 5 mg by mouth daily.    Marland Kitchen aspirin 81 MG tablet Take 81 mg by mouth daily.      . bisoprolol-hydrochlorothiazide (ZIAC) 10-6.25 MG per tablet Take 1 tablet by mouth daily.      . brimonidine (ALPHAGAN) 0.15 % ophthalmic solution Place 1 drop into both eyes 3 (three) times daily.     . Calcium Carbonate (CALTRATE 600 PO) Take 1  tablet by mouth daily.    . IPRATROPIUM BROMIDE NA Place 1 spray into the nose as needed (To dry up mucus in nasal area.).     Marland Kitchen latanoprost (XALATAN) 0.005 % ophthalmic solution Place 1 drop into both eyes daily.     Marland Kitchen losartan-hydrochlorothiazide (HYZAAR) 100-25 MG per tablet Take 1 tablet by mouth daily.    . Multiple Vitamin (MULTIVITAMIN) tablet Take 1 tablet by mouth daily.      . potassium chloride SA (K-DUR,KLOR-CON) 20 MEQ tablet Take 20 mEq by mouth 3 (three) times daily.      Marland Kitchen esomeprazole (NEXIUM) 40 MG capsule Take 40 mg by mouth daily before breakfast.     No current  facility-administered medications on file prior to visit.        Objective:   Physical Exam Blood pressure 144/74, pulse 65, temperature 97.4 F (36.3 C), height 5' 1.5" (1.562 m), weight 141 lb 6.4 oz (64.139 kg). Alert and oriented. Skin warm and dry. Oral mucosa is moist.   . Sclera anicteric, conjunctivae is pink. Thyroid not enlarged. No cervical lymphadenopathy. Lungs clear. Heart regular rate and rhythm.  Abdomen is soft. Bowel sounds are positive. No hepatomegaly. No abdominal masses felt. No tenderness.  No edema to lower extremities.          Assessment & Plan:  GERD controlled at this time with Nexium. OV in 1 year.

## 2015-07-06 NOTE — Patient Instructions (Signed)
OV in 1 year. Continue to Nexium.

## 2015-07-18 DIAGNOSIS — Z23 Encounter for immunization: Secondary | ICD-10-CM | POA: Diagnosis not present

## 2015-08-04 DIAGNOSIS — M47816 Spondylosis without myelopathy or radiculopathy, lumbar region: Secondary | ICD-10-CM | POA: Diagnosis not present

## 2015-08-04 DIAGNOSIS — M9902 Segmental and somatic dysfunction of thoracic region: Secondary | ICD-10-CM | POA: Diagnosis not present

## 2015-08-04 DIAGNOSIS — M4004 Postural kyphosis, thoracic region: Secondary | ICD-10-CM | POA: Diagnosis not present

## 2015-08-04 DIAGNOSIS — M9903 Segmental and somatic dysfunction of lumbar region: Secondary | ICD-10-CM | POA: Diagnosis not present

## 2015-08-04 DIAGNOSIS — M9901 Segmental and somatic dysfunction of cervical region: Secondary | ICD-10-CM | POA: Diagnosis not present

## 2015-08-04 DIAGNOSIS — M47812 Spondylosis without myelopathy or radiculopathy, cervical region: Secondary | ICD-10-CM | POA: Diagnosis not present

## 2015-09-13 DIAGNOSIS — I10 Essential (primary) hypertension: Secondary | ICD-10-CM | POA: Diagnosis not present

## 2015-09-13 DIAGNOSIS — M1990 Unspecified osteoarthritis, unspecified site: Secondary | ICD-10-CM | POA: Diagnosis not present

## 2015-09-13 DIAGNOSIS — M545 Low back pain: Secondary | ICD-10-CM | POA: Diagnosis not present

## 2015-09-13 DIAGNOSIS — K210 Gastro-esophageal reflux disease with esophagitis: Secondary | ICD-10-CM | POA: Diagnosis not present

## 2015-11-21 DIAGNOSIS — H401132 Primary open-angle glaucoma, bilateral, moderate stage: Secondary | ICD-10-CM | POA: Diagnosis not present

## 2016-01-12 DIAGNOSIS — M1990 Unspecified osteoarthritis, unspecified site: Secondary | ICD-10-CM | POA: Diagnosis not present

## 2016-01-12 DIAGNOSIS — K210 Gastro-esophageal reflux disease with esophagitis: Secondary | ICD-10-CM | POA: Diagnosis not present

## 2016-01-12 DIAGNOSIS — I10 Essential (primary) hypertension: Secondary | ICD-10-CM | POA: Diagnosis not present

## 2016-01-26 ENCOUNTER — Encounter: Payer: Self-pay | Admitting: *Deleted

## 2016-01-30 ENCOUNTER — Other Ambulatory Visit (HOSPITAL_COMMUNITY)
Admission: RE | Admit: 2016-01-30 | Discharge: 2016-01-30 | Disposition: A | Discharge status: Home / Self Care | Payer: Medicare Other | Source: Ambulatory Visit | Attending: Obstetrics & Gynecology | Admitting: Obstetrics & Gynecology

## 2016-01-30 ENCOUNTER — Encounter: Payer: Self-pay | Admitting: Obstetrics & Gynecology

## 2016-01-30 ENCOUNTER — Ambulatory Visit (INDEPENDENT_AMBULATORY_CARE_PROVIDER_SITE_OTHER): Payer: Medicare Other | Admitting: Obstetrics & Gynecology

## 2016-01-30 VITALS — BP 160/80 | HR 96 | Ht 61.2 in | Wt 141.0 lb

## 2016-01-30 DIAGNOSIS — Z124 Encounter for screening for malignant neoplasm of cervix: Secondary | ICD-10-CM | POA: Diagnosis not present

## 2016-01-30 DIAGNOSIS — IMO0002 Reserved for concepts with insufficient information to code with codable children: Secondary | ICD-10-CM

## 2016-01-30 DIAGNOSIS — N8110 Cystocele, unspecified: Secondary | ICD-10-CM | POA: Diagnosis not present

## 2016-01-30 DIAGNOSIS — Z01419 Encounter for gynecological examination (general) (routine) without abnormal findings: Secondary | ICD-10-CM

## 2016-01-30 DIAGNOSIS — Z01411 Encounter for gynecological examination (general) (routine) with abnormal findings: Secondary | ICD-10-CM | POA: Diagnosis not present

## 2016-01-30 DIAGNOSIS — IMO0001 Reserved for inherently not codable concepts without codable children: Secondary | ICD-10-CM

## 2016-01-30 NOTE — Progress Notes (Signed)
Patient ID: Kathy Ford, female   DOB: 1928/11/13, 82 y.o.   MRN: [id number] Subjective:     Kathy Ford is a 82 y.o. female here for a routine exam.  No LMP recorded. Patient is postmenopausal. No obstetric history on file. Birth Control Method:  Post menopsuasal Menstrual Calendar(currently): amenorrheic  Current complaints: none.   Current acute medical issues:  none   Recent Gynecologic History No LMP recorded. Patient is postmenopausal. Last Pap: 30 years ago,  normal Last mammogram: 30 years,  normal  Past Medical History  Diagnosis Date  . Hypertension   . Glaucoma   . GERD (gastroesophageal reflux disease)   . Arthritis   . Anemia   . PONV (postoperative nausea and vomiting)   . Incontinence of urine in female     Past Surgical History  Procedure Laterality Date  . Inguinal hernia repair  1998  . Laparoscopic cholecystectomy    . Total knee arthroplasty Bilateral     x 2 (both) one in 2010 and 2011  . Esophagogastroduodenoscopy  04/23/2012    Procedure: ESOPHAGOGASTRODUODENOSCOPY (EGD);  Surgeon: Rogene Houston, MD;  Location: [facility];  Service: Endoscopy;  Laterality: N/A;  1200  . Colonoscopy N/A 12/19/2012    Procedure: COLONOSCOPY;  Surgeon: Rogene Houston, MD;  Location: [facility];  Service: Endoscopy;  Laterality: N/A;  1030-moved to 1225 Ann to notify pt  . Breast biopsy      x2  . Joint replacement Bilateral   . Inguinal hernia repair Left 02/19/2014    Procedure: HERNIA REPAIR INGUINAL ADULT;  Surgeon: Jamesetta So, MD;  Location: [facility];  Service: General;  Laterality: Left;  . Insertion of mesh Left 02/19/2014    Procedure: INSERTION OF MESH;  Surgeon: Jamesetta So, MD;  Location: [facility];  Service: General;  Laterality: Left;    OB History    No data available      Social History   Social History  . Marital Status: Married    Spouse Name: N/A  . Number of Children: N/A  . Years of Education: N/A   Occupational History   . Retired - [employer]  . Smoking status: Never Smoker   . Smokeless tobacco: Never Used  . Alcohol Use: No  . Drug Use: No  . Sexual Activity: Yes    Birth Control/ Protection: Post-menopausal   Other Topics Concern  . None   Social History Narrative   Married   Gets regular exercise    Family History  Problem Relation Age of Onset  . Coronary artery disease Other   . COPD Other   . Stroke Other   . Colon polyps Sister   . Colon cancer Neg Hx      Current outpatient prescriptions:  .  amLODipine (NORVASC) 5 MG tablet, Take 5 mg by mouth daily., Disp: , Rfl:  .  aspirin 81 MG tablet, Take 81 mg by mouth daily.  , Disp: , Rfl:  .  bisoprolol-hydrochlorothiazide (ZIAC) 10-6.25 MG per tablet, Take 1 tablet by mouth daily.  , Disp: , Rfl:  .  brimonidine (ALPHAGAN) 0.15 % ophthalmic solution, Place 1 drop into both eyes 3 (three) times daily. , Disp: , Rfl:  .  Calcium Carbonate (CALTRATE 600 PO), Take 1 tablet by mouth daily., Disp: , Rfl:  .  esomeprazole (NEXIUM) 40 MG capsule, Take 40 mg by mouth daily before breakfast.,  Disp: , Rfl:  .  latanoprost (XALATAN) 0.005 % ophthalmic solution, Place 1 drop into both eyes daily. , Disp: , Rfl:  .  losartan-hydrochlorothiazide (HYZAAR) 100-25 MG per tablet, Take 1 tablet by mouth daily., Disp: , Rfl:  .  Multiple Vitamin (MULTIVITAMIN) tablet, Take 1 tablet by mouth daily.  , Disp: , Rfl:  .  potassium chloride SA (K-DUR,KLOR-CON) 20 MEQ tablet, Take 20 mEq by mouth 3 (three) times daily.  , Disp: , Rfl:  .  IPRATROPIUM BROMIDE NA, Place 1 spray into the nose as needed (To dry up mucus in nasal area.). Reported on 01/30/2016, Disp: , Rfl:   Review of Systems  Review of Systems  Constitutional: Negative for fever, chills, weight loss, malaise/fatigue and diaphoresis.  HENT: Negative for hearing loss, ear pain, nosebleeds, congestion, sore throat, neck pain, tinnitus and ear  discharge.   Eyes: Negative for blurred vision, double vision, photophobia, pain, discharge and redness.  Respiratory: Negative for cough, hemoptysis, sputum production, shortness of breath, wheezing and stridor.   Cardiovascular: Negative for chest pain, palpitations, orthopnea, claudication, leg swelling and PND.  Gastrointestinal: negative for abdominal pain. Negative for heartburn, nausea, vomiting, diarrhea, constipation, blood in stool and melena.  Genitourinary: Negative for dysuria, urgency, frequency, hematuria and flank pain.  Musculoskeletal: Negative for myalgias, back pain, joint pain and falls.  Skin: Negative for itching and rash.  Neurological: Negative for dizziness, tingling, tremors, sensory change, speech change, focal weakness, seizures, loss of consciousness, weakness and headaches.  Endo/Heme/Allergies: Negative for environmental allergies and polydipsia. Does not bruise/bleed easily.  Psychiatric/Behavioral: Negative for depression, suicidal ideas, hallucinations, memory loss and substance abuse. The patient is not nervous/anxious and does not have insomnia.        Objective:  Blood pressure 160/80, pulse 96, height 5' 1.2" (1.554 m), weight 141 lb (63.957 kg).   Physical Exam  Vitals reviewed. Constitutional: She is oriented to person, place, and time. She appears well-developed and well-nourished.  HENT:  Head: Normocephalic and atraumatic.        Right Ear: External ear normal.  Left Ear: External ear normal.  Nose: Nose normal.  Mouth/Throat: Oropharynx is clear and moist.  Eyes: Conjunctivae and EOM are normal. Pupils are equal, round, and reactive to light. Right eye exhibits no discharge. Left eye exhibits no discharge. No scleral icterus.  Neck: Normal range of motion. Neck supple. No tracheal deviation present. No thyromegaly present.  Cardiovascular: Normal rate, regular rhythm, normal heart sounds and intact distal pulses.  Exam reveals no gallop and no  friction rub.   No murmur heard. Respiratory: Effort normal and breath sounds normal. No respiratory distress. She has no wheezes. She has no rales. She exhibits no tenderness.  GI: Soft. Bowel sounds are normal. She exhibits no distension and no mass. There is no tenderness. There is no rebound and no guarding.  Genitourinary:  Breasts no masses skin changes or nipple changes bilaterally      Vulva is normal without lesions Vagina is pink moist without discharge Cervix normal in appearance and pap is done, pt specifically requested Uterus is normal size shape and contour Adnexa is negative  {Rectal    hemoccult negative, normal tone, no masses  Musculoskeletal: Normal range of motion. She exhibits no edema and no tenderness.  Neurological: She is alert and oriented to person, place, and time. She has normal reflexes. She displays normal reflexes. No cranial nerve deficit. She exhibits normal muscle tone. Coordination normal.  Skin: Skin is  warm and dry. No rash noted. No erythema. No pallor.  Psychiatric: She has a normal mood and affect. Her behavior is normal. Judgment and thought content normal.       Medications Ordered at today's visit: No orders of the defined types were placed in this encounter.    Other orders placed at today's visit: No orders of the defined types were placed in this encounter.      Assessment:    Healthy female exam.    Plan:    Mammogram ordered. Follow up in: 1 month. pessary Milex ring with support #2     Pt wanted to be fit for her pessary today as well  Chief Complaint  Patient presents with  . Referral    pap/physcial    Blood pressure 160/80, pulse 96, height 5' 1.2" (1.554 m), weight 141 lb (63.957 kg).  WILHEMENIA GRACEY presents today for problems with bladder falling out She is fit for a Milex ring with support #2 She reports no vaginal discharge or vaginal bleeding.  Exam reveals no undue vaginal mucosal pressure of  breakdown, no discharge and no vaginal bleeding.  The pessary is removed, cleaned and replaced without difficulty.    BRITTNY SANROMAN will be sen back in 1 months for continued follow up.  Florian Buff, MD   01/30/2016 11:12 AM

## 2016-02-01 LAB — CYTOLOGY - PAP

## 2016-02-03 ENCOUNTER — Other Ambulatory Visit: Payer: Self-pay | Admitting: Obstetrics & Gynecology

## 2016-02-03 DIAGNOSIS — Z1231 Encounter for screening mammogram for malignant neoplasm of breast: Secondary | ICD-10-CM

## 2016-02-13 ENCOUNTER — Ambulatory Visit (HOSPITAL_COMMUNITY)
Admission: RE | Admit: 2016-02-13 | Discharge: 2016-02-13 | Disposition: A | Discharge status: Home / Self Care | Payer: Medicare Other | Source: Ambulatory Visit | Attending: Obstetrics & Gynecology | Admitting: Obstetrics & Gynecology

## 2016-02-13 DIAGNOSIS — Z1231 Encounter for screening mammogram for malignant neoplasm of breast: Secondary | ICD-10-CM | POA: Insufficient documentation

## 2016-02-15 DIAGNOSIS — L570 Actinic keratosis: Secondary | ICD-10-CM | POA: Diagnosis not present

## 2016-02-15 DIAGNOSIS — L821 Other seborrheic keratosis: Secondary | ICD-10-CM | POA: Diagnosis not present

## 2016-02-24 DIAGNOSIS — H401132 Primary open-angle glaucoma, bilateral, moderate stage: Secondary | ICD-10-CM | POA: Diagnosis not present

## 2016-02-28 ENCOUNTER — Encounter: Payer: Self-pay | Admitting: Obstetrics & Gynecology

## 2016-02-28 ENCOUNTER — Ambulatory Visit (INDEPENDENT_AMBULATORY_CARE_PROVIDER_SITE_OTHER): Payer: Medicare Other | Admitting: Obstetrics & Gynecology

## 2016-02-28 VITALS — BP 170/90 | HR 74 | Wt 141.0 lb

## 2016-02-28 DIAGNOSIS — N8110 Cystocele, unspecified: Secondary | ICD-10-CM

## 2016-02-28 DIAGNOSIS — IMO0001 Reserved for inherently not codable concepts without codable children: Secondary | ICD-10-CM

## 2016-02-28 DIAGNOSIS — IMO0002 Reserved for concepts with insufficient information to code with codable children: Principal | ICD-10-CM

## 2016-02-28 NOTE — Progress Notes (Signed)
Patient ID: Kathy Ford, female   DOB: [birth year], 82 y.o.   MRN: [id number] Chief Complaint  Patient presents with  . Follow-up    check pessary    Blood pressure 170/90, pulse 74, weight 141 lb (63.957 kg).  Chief Complaint  Patient presents with  . Follow-up    check pessary    Blood pressure 170/90, pulse 74, weight 141 lb (63.957 kg).   Fitted for pessary originally 01/2016   Kathy Ford presents today for routine follow up related to her pessary.  For [name] cystocoele She uses a Milex ring with support #2 She reports no vaginal discharge or vaginal bleeding.  Exam reveals no undue vaginal mucosal pressure of breakdown, no discharge and no vaginal bleeding.  The pessary is removed, cleaned and replaced without difficulty.    Kathy Ford will be sen back in 3 months for continued follow up.  Florian Buff, MD   02/28/2016 3:09 PM

## 2016-03-01 ENCOUNTER — Encounter (INDEPENDENT_AMBULATORY_CARE_PROVIDER_SITE_OTHER): Payer: Self-pay | Admitting: Internal Medicine

## 2016-05-14 DIAGNOSIS — M1990 Unspecified osteoarthritis, unspecified site: Secondary | ICD-10-CM | POA: Diagnosis not present

## 2016-05-14 DIAGNOSIS — I10 Essential (primary) hypertension: Secondary | ICD-10-CM | POA: Diagnosis not present

## 2016-05-14 DIAGNOSIS — K210 Gastro-esophageal reflux disease with esophagitis: Secondary | ICD-10-CM | POA: Diagnosis not present

## 2016-05-30 ENCOUNTER — Encounter: Payer: Self-pay | Admitting: Obstetrics & Gynecology

## 2016-05-30 ENCOUNTER — Ambulatory Visit (INDEPENDENT_AMBULATORY_CARE_PROVIDER_SITE_OTHER): Payer: Medicare Other | Admitting: Obstetrics & Gynecology

## 2016-05-30 VITALS — BP 160/70 | HR 60 | Ht 62.0 in | Wt 136.0 lb

## 2016-05-30 DIAGNOSIS — N8110 Cystocele, unspecified: Secondary | ICD-10-CM | POA: Diagnosis not present

## 2016-05-30 DIAGNOSIS — IMO0001 Reserved for inherently not codable concepts without codable children: Secondary | ICD-10-CM

## 2016-05-30 DIAGNOSIS — IMO0002 Reserved for concepts with insufficient information to code with codable children: Principal | ICD-10-CM

## 2016-05-30 NOTE — Progress Notes (Signed)
Patient ID: Kathy Ford, female   DOB: October 19, 1928, 82 y.o.   MRN: [id number] Chief Complaint  Patient presents with  . Follow-up    check/ clean pessary    Blood pressure (!) 160/70, pulse 60, height 5\' 2"  (1.575 m), weight 136 lb (61.7 kg).  Chief Complaint  Patient presents with  . Follow-up    check/ clean pessary    Blood pressure (!) 160/70, pulse 60, height 5\' 2"  (1.575 m), weight 136 lb (61.7 kg).   Fitted for pessary originally 01/2016   Kathy Ford presents today for routine follow up related to her pessary.  For [name] cystocoele She uses a Milex ring with support #2 She reports no vaginal discharge or vaginal bleeding.  Exam reveals no undue vaginal mucosal pressure of breakdown, no discharge and no vaginal bleeding.  The pessary is removed, cleaned and replaced without difficulty.    Kathy Ford will be sen back in 3 months for continued follow up.  Florian Buff, MD   05/30/2016 2:11 PM

## 2016-05-31 DIAGNOSIS — H401132 Primary open-angle glaucoma, bilateral, moderate stage: Secondary | ICD-10-CM | POA: Diagnosis not present

## 2016-05-31 DIAGNOSIS — H524 Presbyopia: Secondary | ICD-10-CM | POA: Diagnosis not present

## 2016-05-31 DIAGNOSIS — Z961 Presence of intraocular lens: Secondary | ICD-10-CM | POA: Diagnosis not present

## 2016-06-20 ENCOUNTER — Ambulatory Visit (INDEPENDENT_AMBULATORY_CARE_PROVIDER_SITE_OTHER): Payer: Medicare Other | Admitting: Internal Medicine

## 2016-09-03 ENCOUNTER — Ambulatory Visit: Payer: Medicare Other | Admitting: Obstetrics & Gynecology

## 2016-09-10 ENCOUNTER — Encounter: Payer: Self-pay | Admitting: Obstetrics & Gynecology

## 2016-09-10 ENCOUNTER — Encounter (INDEPENDENT_AMBULATORY_CARE_PROVIDER_SITE_OTHER): Payer: Self-pay

## 2016-09-10 ENCOUNTER — Ambulatory Visit (INDEPENDENT_AMBULATORY_CARE_PROVIDER_SITE_OTHER): Payer: Medicare Other | Admitting: Obstetrics & Gynecology

## 2016-09-10 VITALS — BP 180/80 | HR 76 | Wt 133.0 lb

## 2016-09-10 DIAGNOSIS — Z4689 Encounter for fitting and adjustment of other specified devices: Secondary | ICD-10-CM | POA: Diagnosis not present

## 2016-09-10 DIAGNOSIS — N8111 Cystocele, midline: Secondary | ICD-10-CM | POA: Diagnosis not present

## 2016-09-10 NOTE — Progress Notes (Signed)
Patient ID: [name], female   DOB: 10/24/1928, 82 y.o.   MRN: [id number] Chief Complaint  Patient presents with  . Pessary Check    clean    Blood pressure (!) 180/80, pulse 76, weight 133 lb (60.3 kg).  Chief Complaint  Patient presents with  . Pessary Check    clean    Blood pressure (!) 180/80, pulse 76, weight 133 lb (60.3 kg).   Fitted for pessary originally 01/2016   Kathy Ford presents today for routine follow up related to her pessary.  For [name] cystocoele She uses a Milex ring with support #2 She reports no vaginal discharge or vaginal bleeding.  Exam reveals no undue vaginal mucosal pressure of breakdown, no discharge and no vaginal bleeding.  The pessary is removed, cleaned and replaced without difficulty.    [name] will be sen back in 3 months for continued follow up.  Florian Buff, MD   09/10/2016 3:33 PM

## 2016-11-07 DIAGNOSIS — K210 Gastro-esophageal reflux disease with esophagitis: Secondary | ICD-10-CM | POA: Diagnosis not present

## 2016-11-07 DIAGNOSIS — Z634 Disappearance and death of family member: Secondary | ICD-10-CM | POA: Diagnosis not present

## 2016-11-07 DIAGNOSIS — I10 Essential (primary) hypertension: Secondary | ICD-10-CM | POA: Diagnosis not present

## 2016-11-07 DIAGNOSIS — M1990 Unspecified osteoarthritis, unspecified site: Secondary | ICD-10-CM | POA: Diagnosis not present

## 2016-11-08 DIAGNOSIS — Z23 Encounter for immunization: Secondary | ICD-10-CM | POA: Diagnosis not present

## 2016-11-08 DIAGNOSIS — D649 Anemia, unspecified: Secondary | ICD-10-CM | POA: Diagnosis not present

## 2016-11-08 DIAGNOSIS — I10 Essential (primary) hypertension: Secondary | ICD-10-CM | POA: Diagnosis not present

## 2016-11-08 DIAGNOSIS — M1990 Unspecified osteoarthritis, unspecified site: Secondary | ICD-10-CM | POA: Diagnosis not present

## 2016-11-08 DIAGNOSIS — Z634 Disappearance and death of family member: Secondary | ICD-10-CM | POA: Diagnosis not present

## 2016-11-08 DIAGNOSIS — K210 Gastro-esophageal reflux disease with esophagitis: Secondary | ICD-10-CM | POA: Diagnosis not present

## 2016-11-15 DIAGNOSIS — H16223 Keratoconjunctivitis sicca, not specified as Sjogren's, bilateral: Secondary | ICD-10-CM | POA: Diagnosis not present

## 2016-11-15 DIAGNOSIS — H401132 Primary open-angle glaucoma, bilateral, moderate stage: Secondary | ICD-10-CM | POA: Diagnosis not present

## 2016-11-30 DIAGNOSIS — Z471 Aftercare following joint replacement surgery: Secondary | ICD-10-CM | POA: Diagnosis not present

## 2016-11-30 DIAGNOSIS — Z96653 Presence of artificial knee joint, bilateral: Secondary | ICD-10-CM | POA: Diagnosis not present

## 2016-11-30 DIAGNOSIS — Z9889 Other specified postprocedural states: Secondary | ICD-10-CM | POA: Diagnosis not present

## 2016-11-30 DIAGNOSIS — Z96651 Presence of right artificial knee joint: Secondary | ICD-10-CM | POA: Diagnosis not present

## 2016-12-05 DIAGNOSIS — Z0000 Encounter for general adult medical examination without abnormal findings: Secondary | ICD-10-CM | POA: Diagnosis not present

## 2016-12-12 DIAGNOSIS — M25561 Pain in right knee: Secondary | ICD-10-CM | POA: Diagnosis not present

## 2016-12-14 DIAGNOSIS — M25561 Pain in right knee: Secondary | ICD-10-CM | POA: Diagnosis not present

## 2016-12-18 DIAGNOSIS — M25561 Pain in right knee: Secondary | ICD-10-CM | POA: Diagnosis not present

## 2016-12-19 DIAGNOSIS — M25561 Pain in right knee: Secondary | ICD-10-CM | POA: Diagnosis not present

## 2016-12-21 DIAGNOSIS — M25561 Pain in right knee: Secondary | ICD-10-CM | POA: Diagnosis not present

## 2016-12-24 DIAGNOSIS — M25561 Pain in right knee: Secondary | ICD-10-CM | POA: Diagnosis not present

## 2016-12-31 DIAGNOSIS — M25561 Pain in right knee: Secondary | ICD-10-CM | POA: Diagnosis not present

## 2017-01-02 DIAGNOSIS — M25561 Pain in right knee: Secondary | ICD-10-CM | POA: Diagnosis not present

## 2017-01-08 DIAGNOSIS — M25561 Pain in right knee: Secondary | ICD-10-CM | POA: Diagnosis not present

## 2017-01-09 DIAGNOSIS — M25561 Pain in right knee: Secondary | ICD-10-CM | POA: Diagnosis not present

## 2017-01-10 ENCOUNTER — Encounter (INDEPENDENT_AMBULATORY_CARE_PROVIDER_SITE_OTHER): Payer: Self-pay

## 2017-01-10 ENCOUNTER — Ambulatory Visit (INDEPENDENT_AMBULATORY_CARE_PROVIDER_SITE_OTHER): Payer: Medicare Other | Admitting: Obstetrics & Gynecology

## 2017-01-10 ENCOUNTER — Encounter: Payer: Self-pay | Admitting: Obstetrics & Gynecology

## 2017-01-10 VITALS — BP 162/84 | HR 84 | Wt 128.0 lb

## 2017-01-10 DIAGNOSIS — N8111 Cystocele, midline: Secondary | ICD-10-CM | POA: Diagnosis not present

## 2017-01-10 DIAGNOSIS — Z4689 Encounter for fitting and adjustment of other specified devices: Secondary | ICD-10-CM | POA: Diagnosis not present

## 2017-01-11 DIAGNOSIS — M25561 Pain in right knee: Secondary | ICD-10-CM | POA: Diagnosis not present

## 2017-01-13 NOTE — Progress Notes (Signed)
Patient ID: Kathy Ford, female   DOB: 03-25-1929, 83 y.o.   MRN: [id number] Chief Complaint  Patient presents with  . Follow-up    pessary    Blood pressure (!) 162/84, pulse 84, weight 128 lb (58.1 kg).  Chief Complaint  Patient presents with  . Follow-up    pessary    Blood pressure (!) 162/84, pulse 84, weight 128 lb (58.1 kg).   Fitted for pessary originally 01/2016   Kathy Ford presents today for routine follow up related to her pessary.  For [name] cystocoele She uses a Milex ring with support #2 She reports no vaginal discharge or vaginal bleeding.  Exam reveals no undue vaginal mucosal pressure of breakdown, no discharge and no vaginal bleeding.  The pessary is removed, cleaned and replaced without difficulty.    Kathy Ford will be sen back in 3 months for continued follow up.  Florian Buff, MD   01/13/2017 8:10 PM

## 2017-02-12 DIAGNOSIS — L821 Other seborrheic keratosis: Secondary | ICD-10-CM | POA: Diagnosis not present

## 2017-02-12 DIAGNOSIS — D485 Neoplasm of uncertain behavior of skin: Secondary | ICD-10-CM | POA: Diagnosis not present

## 2017-02-12 DIAGNOSIS — C44329 Squamous cell carcinoma of skin of other parts of face: Secondary | ICD-10-CM | POA: Diagnosis not present

## 2017-02-12 DIAGNOSIS — L309 Dermatitis, unspecified: Secondary | ICD-10-CM | POA: Diagnosis not present

## 2017-02-12 DIAGNOSIS — L570 Actinic keratosis: Secondary | ICD-10-CM | POA: Diagnosis not present

## 2017-02-26 DIAGNOSIS — L570 Actinic keratosis: Secondary | ICD-10-CM | POA: Diagnosis not present

## 2017-02-26 DIAGNOSIS — C44329 Squamous cell carcinoma of skin of other parts of face: Secondary | ICD-10-CM | POA: Diagnosis not present

## 2017-03-06 DIAGNOSIS — I10 Essential (primary) hypertension: Secondary | ICD-10-CM | POA: Diagnosis not present

## 2017-03-06 DIAGNOSIS — K210 Gastro-esophageal reflux disease with esophagitis: Secondary | ICD-10-CM | POA: Diagnosis not present

## 2017-03-06 DIAGNOSIS — Z634 Disappearance and death of family member: Secondary | ICD-10-CM | POA: Diagnosis not present

## 2017-03-06 DIAGNOSIS — R609 Edema, unspecified: Secondary | ICD-10-CM | POA: Diagnosis not present

## 2017-03-07 DIAGNOSIS — Z4802 Encounter for removal of sutures: Secondary | ICD-10-CM | POA: Diagnosis not present

## 2017-04-26 ENCOUNTER — Other Ambulatory Visit: Payer: Self-pay

## 2017-05-13 ENCOUNTER — Encounter: Payer: Self-pay | Admitting: Obstetrics & Gynecology

## 2017-05-13 ENCOUNTER — Ambulatory Visit (INDEPENDENT_AMBULATORY_CARE_PROVIDER_SITE_OTHER): Payer: Medicare Other | Admitting: Obstetrics & Gynecology

## 2017-05-13 ENCOUNTER — Encounter (INDEPENDENT_AMBULATORY_CARE_PROVIDER_SITE_OTHER): Payer: Self-pay

## 2017-05-13 VITALS — BP 140/82 | HR 84 | Wt 123.0 lb

## 2017-05-13 DIAGNOSIS — Z4689 Encounter for fitting and adjustment of other specified devices: Secondary | ICD-10-CM

## 2017-05-13 DIAGNOSIS — N760 Acute vaginitis: Secondary | ICD-10-CM | POA: Diagnosis not present

## 2017-05-13 DIAGNOSIS — B9689 Other specified bacterial agents as the cause of diseases classified elsewhere: Secondary | ICD-10-CM | POA: Diagnosis not present

## 2017-05-13 DIAGNOSIS — N8111 Cystocele, midline: Secondary | ICD-10-CM

## 2017-05-13 MED ORDER — METRONIDAZOLE 0.75 % VA GEL: VAGINAL | 0 refills | Status: DC

## 2017-05-13 NOTE — Progress Notes (Signed)
Chief Complaint  Patient presents with  . Follow-up    pessary   Blood pressure 140/82, pulse 84, weight 123 lb (55.8 kg).   Kathy Ford is in today for follow-up of her pessary She uses a Milex ring with support #2 She was originally fitted in April 2017 for a grade 3 cystocele Today she is reporting a malodorous vaginal discharge but no bleeding and this is a new complaint for her Upon review of my previous notes this is the first time she's had this problem  Her pessary is somewhat difficult to remove as it is chronically It is removed and cleaned and replaced without difficulty  Meds ordered this encounter  Medications  . metroNIDAZOLE (METROGEL VAGINAL) 0.75 % vaginal gel    Sig: Nightly x 5 nights    Dispense:  70 g    Refill:  0    We will follow her up in 3 months And as noted above she is given a prescription for MetroGel vaginal cream to use every other night for 5 nights of total therapy  Florian Buff, MD 05/13/2017 2:47 PM

## 2017-07-15 DIAGNOSIS — K210 Gastro-esophageal reflux disease with esophagitis: Secondary | ICD-10-CM | POA: Diagnosis not present

## 2017-07-15 DIAGNOSIS — Z634 Disappearance and death of family member: Secondary | ICD-10-CM | POA: Diagnosis not present

## 2017-07-15 DIAGNOSIS — M1990 Unspecified osteoarthritis, unspecified site: Secondary | ICD-10-CM | POA: Diagnosis not present

## 2017-07-15 DIAGNOSIS — I10 Essential (primary) hypertension: Secondary | ICD-10-CM | POA: Diagnosis not present

## 2017-07-26 DIAGNOSIS — H401132 Primary open-angle glaucoma, bilateral, moderate stage: Secondary | ICD-10-CM | POA: Diagnosis not present

## 2017-08-13 ENCOUNTER — Ambulatory Visit: Payer: Medicare Other | Admitting: Obstetrics & Gynecology

## 2017-08-20 ENCOUNTER — Ambulatory Visit: Payer: Medicare Other | Admitting: Obstetrics & Gynecology

## 2017-09-04 DIAGNOSIS — L821 Other seborrheic keratosis: Secondary | ICD-10-CM | POA: Diagnosis not present

## 2017-09-04 DIAGNOSIS — L570 Actinic keratosis: Secondary | ICD-10-CM | POA: Diagnosis not present

## 2017-09-04 DIAGNOSIS — Z85828 Personal history of other malignant neoplasm of skin: Secondary | ICD-10-CM | POA: Diagnosis not present

## 2017-09-10 ENCOUNTER — Ambulatory Visit (INDEPENDENT_AMBULATORY_CARE_PROVIDER_SITE_OTHER): Payer: Medicare Other | Admitting: Obstetrics & Gynecology

## 2017-09-10 ENCOUNTER — Encounter: Payer: Self-pay | Admitting: Obstetrics & Gynecology

## 2017-09-10 VITALS — BP 168/88 | HR 77 | Ht 61.5 in | Wt 127.0 lb

## 2017-09-10 DIAGNOSIS — Z4689 Encounter for fitting and adjustment of other specified devices: Secondary | ICD-10-CM | POA: Diagnosis not present

## 2017-09-10 DIAGNOSIS — M79671 Pain in right foot: Secondary | ICD-10-CM | POA: Diagnosis not present

## 2017-09-10 DIAGNOSIS — M79672 Pain in left foot: Secondary | ICD-10-CM | POA: Diagnosis not present

## 2017-09-10 DIAGNOSIS — M25579 Pain in unspecified ankle and joints of unspecified foot: Secondary | ICD-10-CM | POA: Diagnosis not present

## 2017-09-10 DIAGNOSIS — N8111 Cystocele, midline: Secondary | ICD-10-CM

## 2017-09-10 NOTE — Progress Notes (Signed)
  Patient ID: Kathy Ford, female   DOB: 1929/08/22, 83 y.o.   MRN: [id number] Kathy Ford is in today for routine maintenance of her Milex ring with support #2 pessary Originally she was fit in April 2017 for grade 3 cystocele At her last visit in August she had a disruption in her vaginal microflora resulting in bacterial vaginosis And that was the first time she has had this problem She took 4 out of the 5 days of the MetroGel and reported some back spasms so she stopped the medicine did not take the fifth dose Today she states she is not having any problems with vaginal discharge or vaginal odor and no bleeding  Blood pressure (!) 168/88, pulse 77, height 5' 1.5" (1.562 m), weight 127 lb (57.6 kg).   She has normal external genitalia today The vagina is without undue pressure and no discharge or bleeding The pessary is removed without difficulty and washed with warm water and soap The pessary is replaced into the vagina once again with good seating Again there is no undue pressure on exam  Ms. Kirn will be seen back in 4 months for follow-up related to routine pessary maintenance

## 2017-10-24 ENCOUNTER — Ambulatory Visit (INDEPENDENT_AMBULATORY_CARE_PROVIDER_SITE_OTHER): Payer: Medicare Other | Admitting: Obstetrics & Gynecology

## 2017-10-24 ENCOUNTER — Encounter: Payer: Self-pay | Admitting: Obstetrics & Gynecology

## 2017-10-24 VITALS — BP 150/70 | HR 82 | Wt 128.0 lb

## 2017-10-24 DIAGNOSIS — Z4689 Encounter for fitting and adjustment of other specified devices: Secondary | ICD-10-CM

## 2017-10-24 DIAGNOSIS — N8111 Cystocele, midline: Secondary | ICD-10-CM | POA: Diagnosis not present

## 2017-10-24 NOTE — Progress Notes (Signed)
Chief Complaint  Patient presents with  . Pessary Check    Blood pressure (!) 150/70, pulse 82, weight 128 lb (58.1 kg).  Kathy Ford presents today for routine follow up related to her pessary.   She uses a Milex ring with support #2 She reports little vaginal discharge or vaginal bleeding.  Exam reveals no undue vaginal mucosal pressure of breakdown, no discharge and no vaginal bleeding.   The pessary is removed, cleaned and replaced without difficulty.    Kathy Ford will be sen back in 4 months for continued follow up.

## 2017-11-05 DIAGNOSIS — Z23 Encounter for immunization: Secondary | ICD-10-CM | POA: Diagnosis not present

## 2017-12-02 DIAGNOSIS — I739 Peripheral vascular disease, unspecified: Secondary | ICD-10-CM | POA: Diagnosis not present

## 2017-12-09 DIAGNOSIS — Z0000 Encounter for general adult medical examination without abnormal findings: Secondary | ICD-10-CM | POA: Diagnosis not present

## 2017-12-19 DIAGNOSIS — Z634 Disappearance and death of family member: Secondary | ICD-10-CM | POA: Diagnosis not present

## 2017-12-19 DIAGNOSIS — Z0000 Encounter for general adult medical examination without abnormal findings: Secondary | ICD-10-CM | POA: Diagnosis not present

## 2017-12-19 DIAGNOSIS — R609 Edema, unspecified: Secondary | ICD-10-CM | POA: Diagnosis not present

## 2017-12-19 DIAGNOSIS — M1990 Unspecified osteoarthritis, unspecified site: Secondary | ICD-10-CM | POA: Diagnosis not present

## 2017-12-19 DIAGNOSIS — I10 Essential (primary) hypertension: Secondary | ICD-10-CM | POA: Diagnosis not present

## 2017-12-19 DIAGNOSIS — K210 Gastro-esophageal reflux disease with esophagitis: Secondary | ICD-10-CM | POA: Diagnosis not present

## 2017-12-19 DIAGNOSIS — H409 Unspecified glaucoma: Secondary | ICD-10-CM | POA: Diagnosis not present

## 2017-12-19 DIAGNOSIS — E785 Hyperlipidemia, unspecified: Secondary | ICD-10-CM | POA: Diagnosis not present

## 2017-12-20 LAB — CMP 10231
ALT: 13 (ref 3–30)
AST: 17
Albumin/Globulin Ratio: 1.1
Albumin: 3.9
Alkaline Phosphatase: 74
BUN/Creatinine Ratio: 22
BUN: 14 (ref 4–21)
Calcium: 9.6
Carbon Dioxide, Total: 27
Chloride: 102
Creat: 0.63
EGFR (African American): 94
EGFR (Non-African Amer.): 81
Globulin, Total: 3.4
Glucose: 96
Potassium: 4.3
Sodium: 143
Total Bilirubin: 0.4
Total Protein: 7.3 (ref 6.4–8.2)

## 2018-01-09 ENCOUNTER — Ambulatory Visit: Payer: Medicare Other | Admitting: Obstetrics & Gynecology

## 2018-02-21 ENCOUNTER — Ambulatory Visit: Payer: Medicare Other | Admitting: Obstetrics & Gynecology

## 2018-03-04 ENCOUNTER — Encounter: Payer: Self-pay | Admitting: Obstetrics & Gynecology

## 2018-03-04 ENCOUNTER — Ambulatory Visit (INDEPENDENT_AMBULATORY_CARE_PROVIDER_SITE_OTHER): Payer: Medicare Other | Admitting: Obstetrics & Gynecology

## 2018-03-04 ENCOUNTER — Encounter (INDEPENDENT_AMBULATORY_CARE_PROVIDER_SITE_OTHER): Payer: Self-pay

## 2018-03-04 VITALS — BP 150/70 | HR 92 | Ht 62.0 in | Wt 124.0 lb

## 2018-03-04 DIAGNOSIS — N8111 Cystocele, midline: Secondary | ICD-10-CM

## 2018-03-04 DIAGNOSIS — Z4689 Encounter for fitting and adjustment of other specified devices: Secondary | ICD-10-CM | POA: Diagnosis not present

## 2018-03-04 NOTE — Progress Notes (Signed)
Chief Complaint  Patient presents with  . Pessary Check    clean    Blood pressure (!) 150/70, pulse 92, height 5\' 2"  (1.575 m), weight 124 lb (56.2 kg).  Kathy Ford presents today for routine follow up related to her pessary.   She uses a Milex ring with support #2 She reports little vaginal discharge or vaginal bleeding.  Exam reveals no undue vaginal mucosal pressure of breakdown, no discharge and no vaginal bleeding.  The pessary is removed, cleaned and replaced without difficulty.    Kathy Ford will be sen back in 4 months for continued follow up.  Florian Buff, MD  03/04/2018 3:33 PM

## 2018-03-10 DIAGNOSIS — E1151 Type 2 diabetes mellitus with diabetic peripheral angiopathy without gangrene: Secondary | ICD-10-CM | POA: Diagnosis not present

## 2018-03-10 DIAGNOSIS — I739 Peripheral vascular disease, unspecified: Secondary | ICD-10-CM | POA: Diagnosis not present

## 2018-03-10 DIAGNOSIS — E1140 Type 2 diabetes mellitus with diabetic neuropathy, unspecified: Secondary | ICD-10-CM | POA: Diagnosis not present

## 2018-03-17 DIAGNOSIS — L570 Actinic keratosis: Secondary | ICD-10-CM | POA: Diagnosis not present

## 2018-03-17 DIAGNOSIS — L821 Other seborrheic keratosis: Secondary | ICD-10-CM | POA: Diagnosis not present

## 2018-03-17 DIAGNOSIS — Z85828 Personal history of other malignant neoplasm of skin: Secondary | ICD-10-CM | POA: Diagnosis not present

## 2018-04-14 DIAGNOSIS — E785 Hyperlipidemia, unspecified: Secondary | ICD-10-CM | POA: Diagnosis not present

## 2018-04-14 DIAGNOSIS — M1990 Unspecified osteoarthritis, unspecified site: Secondary | ICD-10-CM | POA: Diagnosis not present

## 2018-04-14 DIAGNOSIS — K210 Gastro-esophageal reflux disease with esophagitis: Secondary | ICD-10-CM | POA: Diagnosis not present

## 2018-04-14 DIAGNOSIS — I10 Essential (primary) hypertension: Secondary | ICD-10-CM | POA: Diagnosis not present

## 2018-05-22 ENCOUNTER — Encounter: Payer: Self-pay | Admitting: Obstetrics & Gynecology

## 2018-05-26 DIAGNOSIS — M79671 Pain in right foot: Secondary | ICD-10-CM | POA: Diagnosis not present

## 2018-05-26 DIAGNOSIS — M79672 Pain in left foot: Secondary | ICD-10-CM | POA: Diagnosis not present

## 2018-05-26 DIAGNOSIS — L600 Ingrowing nail: Secondary | ICD-10-CM | POA: Diagnosis not present

## 2018-05-26 DIAGNOSIS — I739 Peripheral vascular disease, unspecified: Secondary | ICD-10-CM | POA: Diagnosis not present

## 2018-07-04 ENCOUNTER — Encounter: Payer: Self-pay | Admitting: Obstetrics & Gynecology

## 2018-07-04 ENCOUNTER — Ambulatory Visit: Payer: Medicare Other | Admitting: Obstetrics & Gynecology

## 2018-07-07 ENCOUNTER — Ambulatory Visit: Payer: Medicare Other | Admitting: Obstetrics & Gynecology

## 2018-07-15 ENCOUNTER — Ambulatory Visit (INDEPENDENT_AMBULATORY_CARE_PROVIDER_SITE_OTHER): Payer: Medicare Other | Admitting: Obstetrics & Gynecology

## 2018-07-15 ENCOUNTER — Encounter: Payer: Self-pay | Admitting: Obstetrics & Gynecology

## 2018-07-15 ENCOUNTER — Encounter (INDEPENDENT_AMBULATORY_CARE_PROVIDER_SITE_OTHER): Payer: Self-pay

## 2018-07-15 VITALS — BP 181/82 | HR 80 | Ht 59.0 in | Wt 123.0 lb

## 2018-07-15 DIAGNOSIS — N8111 Cystocele, midline: Secondary | ICD-10-CM | POA: Diagnosis not present

## 2018-07-15 DIAGNOSIS — Z4689 Encounter for fitting and adjustment of other specified devices: Secondary | ICD-10-CM | POA: Diagnosis not present

## 2018-07-15 NOTE — Progress Notes (Signed)
Chief Complaint  Patient presents with  . pessary maintanence    Blood pressure (!) 181/82, pulse 80, height 4\' 11"  (1.499 m), weight 123 lb (55.8 kg).  Kathy Ford presents today for routine follow up related to her pessary.   She uses a Milex ring with support #2 She reports little vaginal discharge or vaginal bleeding.  Exam reveals no undue vaginal mucosal pressure of breakdown, little discharge and little vaginal bleeding.  The pessary is removed, cleaned and replaced without difficulty.    Kathy Ford will be sen back in 4 months for continued follow up.  Florian Buff, MD  07/15/2018 12:23 PM

## 2018-08-13 DIAGNOSIS — H401132 Primary open-angle glaucoma, bilateral, moderate stage: Secondary | ICD-10-CM | POA: Diagnosis not present

## 2018-08-14 DIAGNOSIS — I10 Essential (primary) hypertension: Secondary | ICD-10-CM | POA: Diagnosis not present

## 2018-08-14 DIAGNOSIS — K210 Gastro-esophageal reflux disease with esophagitis: Secondary | ICD-10-CM | POA: Diagnosis not present

## 2018-08-14 DIAGNOSIS — Z634 Disappearance and death of family member: Secondary | ICD-10-CM | POA: Diagnosis not present

## 2018-08-14 DIAGNOSIS — M1990 Unspecified osteoarthritis, unspecified site: Secondary | ICD-10-CM | POA: Diagnosis not present

## 2018-08-14 DIAGNOSIS — Z23 Encounter for immunization: Secondary | ICD-10-CM | POA: Diagnosis not present

## 2018-08-18 DIAGNOSIS — I739 Peripheral vascular disease, unspecified: Secondary | ICD-10-CM | POA: Diagnosis not present

## 2018-08-18 DIAGNOSIS — L600 Ingrowing nail: Secondary | ICD-10-CM | POA: Diagnosis not present

## 2018-08-18 DIAGNOSIS — M79672 Pain in left foot: Secondary | ICD-10-CM | POA: Diagnosis not present

## 2018-08-18 DIAGNOSIS — M79671 Pain in right foot: Secondary | ICD-10-CM | POA: Diagnosis not present

## 2018-11-03 DIAGNOSIS — M79671 Pain in right foot: Secondary | ICD-10-CM | POA: Diagnosis not present

## 2018-11-03 DIAGNOSIS — L600 Ingrowing nail: Secondary | ICD-10-CM | POA: Diagnosis not present

## 2018-11-03 DIAGNOSIS — M79672 Pain in left foot: Secondary | ICD-10-CM | POA: Diagnosis not present

## 2018-11-03 DIAGNOSIS — I739 Peripheral vascular disease, unspecified: Secondary | ICD-10-CM | POA: Diagnosis not present

## 2018-11-13 ENCOUNTER — Ambulatory Visit: Payer: Medicare Other | Admitting: Obstetrics & Gynecology

## 2018-11-18 ENCOUNTER — Encounter (INDEPENDENT_AMBULATORY_CARE_PROVIDER_SITE_OTHER): Payer: Self-pay

## 2018-11-18 ENCOUNTER — Encounter: Payer: Self-pay | Admitting: Obstetrics & Gynecology

## 2018-11-18 ENCOUNTER — Ambulatory Visit (INDEPENDENT_AMBULATORY_CARE_PROVIDER_SITE_OTHER): Payer: Medicare Other | Admitting: Obstetrics & Gynecology

## 2018-11-18 VITALS — BP 134/66 | HR 69 | Ht 59.0 in | Wt 123.0 lb

## 2018-11-18 DIAGNOSIS — Z4689 Encounter for fitting and adjustment of other specified devices: Secondary | ICD-10-CM

## 2018-11-18 DIAGNOSIS — N8111 Cystocele, midline: Secondary | ICD-10-CM

## 2018-11-18 NOTE — Progress Notes (Signed)
Chief Complaint  Patient presents with  . Pessary Check    Blood pressure 134/66, pulse 69, height 4\' 11"  (1.499 m), weight 123 lb (55.8 kg).  Kathy Ford presents today for routine follow up related to her pessary.   She uses a milex ring with support #2 She reports no vaginal discharge or vaginal bleeding.  Exam reveals no undue vaginal mucosal pressure of breakdown, no discharge and no vaginal bleeding.  The pessary is removed, cleaned and replaced without difficulty.    [name] will be sen back in 4 months for continued follow up.  Florian Buff, MD  11/18/2018 3:57 PM

## 2018-12-03 DIAGNOSIS — H401132 Primary open-angle glaucoma, bilateral, moderate stage: Secondary | ICD-10-CM | POA: Diagnosis not present

## 2018-12-03 DIAGNOSIS — Z961 Presence of intraocular lens: Secondary | ICD-10-CM | POA: Diagnosis not present

## 2018-12-03 DIAGNOSIS — H524 Presbyopia: Secondary | ICD-10-CM | POA: Diagnosis not present

## 2018-12-03 DIAGNOSIS — H26492 Other secondary cataract, left eye: Secondary | ICD-10-CM | POA: Diagnosis not present

## 2018-12-21 LAB — CBC WITH DIFF/PLATELET
Basophils: 0
Eosinophils Absolute: 0
Eosinophils, %: 2
HCT: 34 (ref 29–41)
Hemoglobin: 11.4
Immature Granulocytes: 0
Lymphocytes absolute: 2.5 10*3/uL — AB (ref 0.1–1.8)
MCH: 32
MCHC: 33.4
MCV: 96 (ref 76–111)
Monocytes(Absolute): 0.9
Monocytes: 9
Neutro Abs: 6.1
Neutrophils: 63
RBC: 3.56 — AB (ref 3.87–5.11)
RDW: 14
WBC: 9.7
lymph#: 26
platelet count: 322

## 2019-01-06 ENCOUNTER — Encounter: Payer: Self-pay | Admitting: Obstetrics & Gynecology

## 2019-01-06 ENCOUNTER — Ambulatory Visit (INDEPENDENT_AMBULATORY_CARE_PROVIDER_SITE_OTHER): Payer: Medicare Other | Admitting: Obstetrics & Gynecology

## 2019-01-06 ENCOUNTER — Other Ambulatory Visit: Payer: Self-pay

## 2019-01-06 VITALS — BP 156/75 | HR 86 | Ht 59.0 in | Wt 125.0 lb

## 2019-01-06 DIAGNOSIS — N8111 Cystocele, midline: Secondary | ICD-10-CM

## 2019-01-06 DIAGNOSIS — Z4689 Encounter for fitting and adjustment of other specified devices: Secondary | ICD-10-CM | POA: Diagnosis not present

## 2019-01-06 MED ORDER — NYSTATIN-TRIAMCINOLONE 100000-0.1 UNIT/GM-% EX OINT: 1.0000 "application " | TOPICAL_OINTMENT | Freq: Two times a day (BID) | CUTANEOUS | 11 refills | Status: DC

## 2019-01-06 NOTE — Progress Notes (Signed)
Chief Complaint  Patient presents with  . Pessary Check    feel like pessary is out of place    Blood pressure (!) 156/75, pulse 86, height 4\' 11"  (1.499 m), weight 125 lb (56.7 kg).  Kathy Ford presents today for routine follow up related to her pessary.   She uses a Milex ring with support #2 She threw up a couple of weeks ago and was worried it had moved  She reports no vaginal discharge or vaginal bleeding.  Exam reveals no undue vaginal mucosal pressure of breakdown, no discharge and no vaginal bleeding.  The pessary was in proper location  The pessary is removed, cleaned and replaced without difficulty.    [name] will be sen back in keep scheduled months for continued follow up.   Meds ordered this encounter  Medications  . nystatin-triamcinolone ointment (MYCOLOG)    Sig: Apply 1 application topically 2 (two) times daily.    Dispense:  30 g    Refill:  Luck, MD  01/06/2019 11:17 AM

## 2019-01-12 ENCOUNTER — Ambulatory Visit: Payer: Medicare Other | Admitting: Obstetrics & Gynecology

## 2019-02-18 DIAGNOSIS — I739 Peripheral vascular disease, unspecified: Secondary | ICD-10-CM | POA: Diagnosis not present

## 2019-02-18 DIAGNOSIS — M79671 Pain in right foot: Secondary | ICD-10-CM | POA: Diagnosis not present

## 2019-02-18 DIAGNOSIS — M79672 Pain in left foot: Secondary | ICD-10-CM | POA: Diagnosis not present

## 2019-02-18 DIAGNOSIS — L600 Ingrowing nail: Secondary | ICD-10-CM | POA: Diagnosis not present

## 2019-03-15 ENCOUNTER — Encounter (HOSPITAL_COMMUNITY): Payer: Self-pay | Admitting: Emergency Medicine

## 2019-03-15 ENCOUNTER — Emergency Department (HOSPITAL_COMMUNITY)
Admission: EM | Admit: 2019-03-15 | Discharge: 2019-03-15 | Disposition: A | Discharge status: Home / Self Care | Payer: Medicare Other | Attending: Emergency Medicine | Admitting: Emergency Medicine

## 2019-03-15 ENCOUNTER — Emergency Department (HOSPITAL_COMMUNITY): Payer: Medicare Other

## 2019-03-15 ENCOUNTER — Other Ambulatory Visit: Payer: Self-pay

## 2019-03-15 DIAGNOSIS — Y9301 Activity, walking, marching and hiking: Secondary | ICD-10-CM | POA: Diagnosis not present

## 2019-03-15 DIAGNOSIS — S42292A Other displaced fracture of upper end of left humerus, initial encounter for closed fracture: Secondary | ICD-10-CM | POA: Diagnosis not present

## 2019-03-15 DIAGNOSIS — Y998 Other external cause status: Secondary | ICD-10-CM | POA: Insufficient documentation

## 2019-03-15 DIAGNOSIS — S4292XA Fracture of left shoulder girdle, part unspecified, initial encounter for closed fracture: Secondary | ICD-10-CM

## 2019-03-15 DIAGNOSIS — S4992XA Unspecified injury of left shoulder and upper arm, initial encounter: Secondary | ICD-10-CM | POA: Diagnosis present

## 2019-03-15 DIAGNOSIS — W010XXA Fall on same level from slipping, tripping and stumbling without subsequent striking against object, initial encounter: Secondary | ICD-10-CM | POA: Diagnosis not present

## 2019-03-15 DIAGNOSIS — I10 Essential (primary) hypertension: Secondary | ICD-10-CM | POA: Insufficient documentation

## 2019-03-15 DIAGNOSIS — Y92009 Unspecified place in unspecified non-institutional (private) residence as the place of occurrence of the external cause: Secondary | ICD-10-CM | POA: Diagnosis not present

## 2019-03-15 DIAGNOSIS — S42202A Unspecified fracture of upper end of left humerus, initial encounter for closed fracture: Secondary | ICD-10-CM | POA: Diagnosis not present

## 2019-03-15 MED ORDER — TRAMADOL HCL 50 MG PO TABS
50.0000 mg | ORAL_TABLET | Freq: Once | ORAL | Status: AC
  Administered 2019-03-15: 50 mg via ORAL
  Filled 2019-03-15: qty 1

## 2019-03-15 MED ORDER — TRAMADOL HCL 50 MG PO TABS: 50.0000 mg | ORAL_TABLET | Freq: Four times a day (QID) | ORAL | 0 refills | Status: DC | PRN

## 2019-03-15 NOTE — ED Provider Notes (Signed)
[facility] EMERGENCY DEPARTMENT Provider Note   CSN: [id number] Arrival date & time: 03/15/19  2131    History   Chief Complaint Chief Complaint  Patient presents with  . Shoulder Pain    HPI Kathy Ford is a 85 y.o. female.     Patient states that she fell onto her left shoulder.  She complains of pain there.  Patient did not hit her head  The history is provided by the patient. No language interpreter was used.  Shoulder Pain  Location:  Shoulder Shoulder location:  L shoulder Pain details:    Quality:  Aching   Radiates to:  Does not radiate   Severity:  Moderate   Onset quality:  Sudden   Timing:  Constant   Progression:  Worsening Handedness:  Right-handed Associated symptoms: no back pain and no fatigue     Past Medical History:  Diagnosis Date  . Anemia   . Arthritis   . GERD (gastroesophageal reflux disease)   . Glaucoma   . Hypertension   . Incontinence of urine in female   . PONV (postoperative nausea and vomiting)     Patient Active Problem List   Diagnosis Date Noted  . GERD (gastroesophageal reflux disease) 03/25/2012  . HYPERLIPIDEMIA-MIXED 03/25/2009  . HYPERTENSION, UNSPECIFIED 03/25/2009    Past Surgical History:  Procedure Laterality Date  . BREAST BIOPSY     x2  . COLONOSCOPY N/A 12/19/2012   Procedure: COLONOSCOPY;  Surgeon: Rogene Houston, MD;  Location: [facility];  Service: Endoscopy;  Laterality: N/A;  1030-moved to 1225 Ann to notify pt  . ESOPHAGOGASTRODUODENOSCOPY  04/23/2012   Procedure: ESOPHAGOGASTRODUODENOSCOPY (EGD);  Surgeon: Rogene Houston, MD;  Location: [facility];  Service: Endoscopy;  Laterality: N/A;  1200  . INGUINAL HERNIA REPAIR  1998  . INGUINAL HERNIA REPAIR Left 02/19/2014   Procedure: HERNIA REPAIR INGUINAL ADULT;  Surgeon: Jamesetta So, MD;  Location: [facility] ORS;  Service: General;  Laterality: Left;  . INSERTION OF MESH Left 02/19/2014   Procedure: INSERTION OF MESH;  Surgeon: Jamesetta So, MD;   Location: [facility];  Service: General;  Laterality: Left;  . JOINT REPLACEMENT Bilateral   . LAPAROSCOPIC CHOLECYSTECTOMY    . TOTAL KNEE ARTHROPLASTY Bilateral    x 2 (both) one in 2010 and 2011     OB History    Gravida  3   Para  3   Term  3   Preterm      AB      Living  2     SAB      TAB      Ectopic      Multiple      Live Births  3            Home Medications    Prior to Admission medications   Medication Sig Start Date End Date Taking? Authorizing Provider  acetaminophen (TYLENOL) 500 MG tablet Take 1,000 mg by mouth as needed.    [provider]  amLODipine (NORVASC) 10 MG tablet Take 10 mg by mouth daily.     [provider]  aspirin 81 MG tablet Take 81 mg by mouth daily.      [provider]  brimonidine (ALPHAGAN) 0.15 % ophthalmic solution Place 1 drop into both eyes 3 (three) times daily.     [provider]  Calcium Carbonate (CALTRATE 600 PO) Take 1 tablet by mouth daily.    [provider]  esomeprazole (NEXIUM) 40 MG capsule Take 40 mg by mouth daily before breakfast.    [provider]  latanoprost (XALATAN) 0.005 % ophthalmic solution Place 1 drop into both eyes daily.     [provider]  losartan-hydrochlorothiazide (HYZAAR) 100-25 MG per tablet Take 1 tablet by mouth daily.    [provider]  Multiple Vitamin (MULTIVITAMIN) tablet Take 1 tablet by mouth daily.      [provider]  nystatin-triamcinolone ointment (MYCOLOG) Apply 1 application topically 2 (two) times daily. 01/06/19   Florian Buff, MD  potassium chloride SA (K-DUR,KLOR-CON) 20 MEQ tablet Take 20 mEq by mouth 3 (three) times daily.      [provider]  traMADol (ULTRAM) 50 MG tablet Take 1 tablet (50 mg total) by mouth every 6 (six) hours as needed. 03/15/19   Milton Ferguson, MD    Family History Family History  Problem Relation Age of Onset  . Colon polyps Sister   . Cancer  Brother   . Coronary artery disease Other   . COPD Other   . Stroke Other   . Cancer Son   . Colon cancer Neg Hx     Social History Social History   Tobacco Use  . Smoking status: Never Smoker  . Smokeless tobacco: Never Used  Substance Use Topics  . Alcohol use: No  . Drug use: No     Allergies   Bee venom and Caine-1 [lidocaine]   Review of Systems Review of Systems  Constitutional: Negative for appetite change and fatigue.  HENT: Negative for congestion, ear discharge and sinus pressure.   Eyes: Negative for discharge.  Respiratory: Negative for cough.   Cardiovascular: Negative for chest pain.  Gastrointestinal: Negative for abdominal pain and diarrhea.  Genitourinary: Negative for frequency and hematuria.  Musculoskeletal: Negative for back pain.       Left shoulder pain  Skin: Negative for rash.  Neurological: Negative for seizures and headaches.  Psychiatric/Behavioral: Negative for hallucinations.  All other systems reviewed and are negative.    Physical Exam Updated Vital Signs BP (!) 186/89 (BP Location: Right Arm)   Pulse 93   Temp 97.9 F (36.6 C) (Oral)   Resp 16   Ht 5' (1.524 m)   Wt 54 kg   SpO2 100%   BMI 23.24 kg/m   Physical Exam Vitals signs and nursing note reviewed.  Constitutional:      Appearance: She is well-developed.  HENT:     Head: Normocephalic.     Nose: Nose normal.  Eyes:     General: No scleral icterus.    Conjunctiva/sclera: Conjunctivae normal.  Neck:     Musculoskeletal: Neck supple.     Thyroid: No thyromegaly.  Cardiovascular:     Rate and Rhythm: Normal rate and regular rhythm.     Heart sounds: No murmur. No friction rub. No gallop.   Pulmonary:     Breath sounds: No stridor. No wheezing or rales.  Chest:     Chest wall: No tenderness.  Abdominal:     General: There is no distension.     Tenderness: There is no abdominal tenderness. There is no rebound.  Musculoskeletal:     Comments: Under left  shoulder  Lymphadenopathy:     Cervical: No cervical adenopathy.  Skin:    Findings: No erythema or rash.  Neurological:     Mental Status: She is alert and oriented to person, place, and time.  Motor: No abnormal muscle tone.     Coordination: Coordination normal.  Psychiatric:        Behavior: Behavior normal.      ED Treatments / Results  Labs (all labs ordered are listed, but only abnormal results are displayed) Labs Reviewed - No data to display  EKG None  Radiology Dg Shoulder Left  Result Date: 03/15/2019 CLINICAL DATA:  85 year old female with history of fall injuring the left shoulder on a table. EXAM: LEFT SHOULDER - 2+ VIEW COMPARISON:  No priors. FINDINGS: There is an acute mildly displaced fracture which appears mildly impacted through the anatomic neck of the left proximal humerus. This is poorly demonstrated on today's two view examination. The possibility of mild comminution is considered. Humeral head appears slightly rotated in the glenoid fossa. IMPRESSION: 1. Acute mildly displaced likely mildly impacted fracture through the anatomic neck of the left proximal humerus. Electronically Signed   By: Vinnie Langton M.D.   On: 03/15/2019 22:29    Procedures Procedures (including critical care time)  Medications Ordered in ED Medications - No data to display   Initial Impression / Assessment and Plan / ED Course  I have reviewed the triage vital signs and the nursing notes.  Pertinent labs & imaging results that were available during my care of the patient were reviewed by me and considered in my medical decision making (see chart for details).   Patient has a fracture in the left shoulder.  She is given a sling and pain medicine will follow-up with orthopedics     Final Clinical Impressions(s) / ED Diagnoses   Final diagnoses:  Shoulder fracture, left, closed, initial encounter    ED Discharge Orders         Ordered    traMADol (ULTRAM) 50 MG  tablet  Every 6 hours PRN     03/15/19 2247           Milton Ferguson, MD 03/15/19 2252

## 2019-03-15 NOTE — ED Notes (Addendum)
Signature pad not working. Discharge instructions explained no questions at this time.

## 2019-03-15 NOTE — ED Triage Notes (Signed)
Feeding dogs who were excited being fed   Knocked her over and she hit her L shoulder hitting the table  Denies other injury

## 2019-03-15 NOTE — Discharge Instructions (Addendum)
Follow-up with Dr. Aline Brochure this week.  Call his office tomorrow for an appointment

## 2019-03-15 NOTE — ED Notes (Signed)
ED Provider at bedside. 

## 2019-03-16 ENCOUNTER — Telehealth: Payer: Self-pay | Admitting: Orthopedic Surgery

## 2019-03-16 NOTE — Telephone Encounter (Signed)
1 week 

## 2019-03-16 NOTE — Telephone Encounter (Signed)
I called to advise, use sling, full time try to sleep in recliner, relax shoulder while in the sling. Voiced understanding.

## 2019-03-16 NOTE — Telephone Encounter (Signed)
Patient went to ER yesterday. She was given a sling, pain medicine and told to see you.  Xray shows:   Acute mildly displaced likely mildly impacted fracture through the anatomic neck of the left proximal humerus.  Please advise

## 2019-03-16 NOTE — Telephone Encounter (Signed)
Kathy Ford this patient is scheduled to come in Monday to see Dr. Aline Brochure. She was seen in the  ER on 6/7). Patient called back with concerns about having to wait a week to be seen and if she can move it, does she take sling off to shower or leave it on. What about pain medication from ER(Tramadol 50 mg) states 1 every 6 hours as needed, if taken as stated she will not have enough to do her until Monday at her appointment here. I did tell her that normally our doctors don't prescribe medication until they are evaluated.States nobody at the ER went over any of this with them and she has a lot of concerns. Had question about getting a hospital bed also.  Please call and advise

## 2019-03-17 DIAGNOSIS — I10 Essential (primary) hypertension: Secondary | ICD-10-CM | POA: Diagnosis not present

## 2019-03-17 DIAGNOSIS — S4292XA Fracture of left shoulder girdle, part unspecified, initial encounter for closed fracture: Secondary | ICD-10-CM | POA: Diagnosis not present

## 2019-03-19 ENCOUNTER — Ambulatory Visit: Payer: Self-pay | Admitting: Obstetrics & Gynecology

## 2019-03-23 ENCOUNTER — Ambulatory Visit (INDEPENDENT_AMBULATORY_CARE_PROVIDER_SITE_OTHER): Payer: Medicare Other | Admitting: Orthopedic Surgery

## 2019-03-23 ENCOUNTER — Encounter: Payer: Self-pay | Admitting: Orthopedic Surgery

## 2019-03-23 ENCOUNTER — Other Ambulatory Visit: Payer: Self-pay

## 2019-03-23 VITALS — BP 168/77 | HR 87 | Temp 97.3°F | Ht 59.0 in | Wt 122.0 lb

## 2019-03-23 DIAGNOSIS — S42295A Other nondisplaced fracture of upper end of left humerus, initial encounter for closed fracture: Secondary | ICD-10-CM

## 2019-03-23 DIAGNOSIS — S42202A Unspecified fracture of upper end of left humerus, initial encounter for closed fracture: Secondary | ICD-10-CM | POA: Insufficient documentation

## 2019-03-23 NOTE — Patient Instructions (Signed)
Sling until you get back

## 2019-03-23 NOTE — Progress Notes (Signed)
Patient ID: Kathy Ford, female   DOB: Apr 24, 1929, 85 y.o.   MRN: [id number]  Chief Complaint  Patient presents with  . Shoulder Injury    Lt shoulder DOI 03/15/19    HPI Kathy Ford is a 85 y.o. female.  Fell 1 week ago c/o left shoulder pain  85 year old female presents for evaluation of left proximal humerus fracture which occurred June 7 when she was knocked over by some dogs.  She was seen in the emergency room.  Summation of the record indicates that she did fall she was treated with a sling after x-rays showed a proximal humerus fracture nondisplaced she was given tramadol for pain which she has not needed  Currently she has mild nonradiating focal left shoulder pain near the proximal humerus without any associated findings other than lack of motion in the left upper extremity no numbness or tingling has been noted   Review of Systems Review of Systems  Skin: Positive for color change.  Neurological: Positive for weakness. Negative for numbness.     Past Medical History:  Diagnosis Date  . Anemia   . Arthritis   . GERD (gastroesophageal reflux disease)   . Glaucoma   . Hypertension   . Incontinence of urine in female   . PONV (postoperative nausea and vomiting)     Past Surgical History:  Procedure Laterality Date  . BREAST BIOPSY     x2  . COLONOSCOPY N/A 12/19/2012   Procedure: COLONOSCOPY;  Surgeon: Rogene Houston, MD;  Location: [facility];  Service: Endoscopy;  Laterality: N/A;  1030-moved to 1225 Ann to notify pt  . ESOPHAGOGASTRODUODENOSCOPY  04/23/2012   Procedure: ESOPHAGOGASTRODUODENOSCOPY (EGD);  Surgeon: Rogene Houston, MD;  Location: [facility];  Service: Endoscopy;  Laterality: N/A;  1200  . INGUINAL HERNIA REPAIR  1998  . INGUINAL HERNIA REPAIR Left 02/19/2014   Procedure: HERNIA REPAIR INGUINAL ADULT;  Surgeon: Jamesetta So, MD;  Location: [facility];  Service: General;  Laterality: Left;  . INSERTION OF MESH Left 02/19/2014   Procedure:  INSERTION OF MESH;  Surgeon: Jamesetta So, MD;  Location: [facility];  Service: General;  Laterality: Left;  . JOINT REPLACEMENT Bilateral   . LAPAROSCOPIC CHOLECYSTECTOMY    . TOTAL KNEE ARTHROPLASTY Bilateral    x 2 (both) one in 2010 and 2011    Family History  Problem Relation Age of Onset  . Colon polyps Sister   . Cancer Brother   . Coronary artery disease Other   . COPD Other   . Stroke Other   . Cancer Son   . Colon cancer Neg Hx     Social History Social History   Tobacco Use  . Smoking status: Never Smoker  . Smokeless tobacco: Never Used  Substance Use Topics  . Alcohol use: No  . Drug use: No    Allergies  Allergen Reactions  . Bee Venom Anaphylaxis and Swelling  . Caine-1 [Lidocaine] Other (See Comments)    novocaine-passed out; due to epinephrine added to novocaine.    Current Outpatient Medications  Medication Sig Dispense Refill  . acetaminophen (TYLENOL) 500 MG tablet Take 1,000 mg by mouth as needed.    Marland Kitchen amLODipine (NORVASC) 10 MG tablet Take 10 mg by mouth daily.     Marland Kitchen aspirin 81 MG tablet Take 81 mg by mouth daily.      . brimonidine (ALPHAGAN) 0.15 % ophthalmic solution Place 1 drop into both eyes 3 (three)  times daily.     . Calcium Carbonate (CALTRATE 600 PO) Take 1 tablet by mouth daily.    Marland Kitchen esomeprazole (NEXIUM) 40 MG capsule Take 40 mg by mouth daily before breakfast.    . latanoprost (XALATAN) 0.005 % ophthalmic solution Place 1 drop into both eyes daily.     Marland Kitchen losartan-hydrochlorothiazide (HYZAAR) 100-25 MG per tablet Take 1 tablet by mouth daily.    . Multiple Vitamin (MULTIVITAMIN) tablet Take 1 tablet by mouth daily.      Marland Kitchen nystatin-triamcinolone ointment (MYCOLOG) Apply 1 application topically 2 (two) times daily. 30 g 11  . potassium chloride SA (K-DUR,KLOR-CON) 20 MEQ tablet Take 20 mEq by mouth 3 (three) times daily.      . traMADol (ULTRAM) 50 MG tablet Take 1 tablet (50 mg total) by mouth every 6 (six) hours as needed. 20 tablet 0    No current facility-administered medications for this visit.        Physical Exam Blood pressure (!) 168/77, pulse 87, temperature (!) 97.3 F (36.3 C), height 4\' 11"  (1.499 m), weight 122 lb (55.3 kg). Physical Exam The patient is well developed well nourished and well groomed.  Orientation to person place and time is normal  Mood is pleasant.  Ambulatory status normal with a cane Cervical spine exam is as follows nontender but she does have thoracic chronic kyphosis from probable osteoporosis  Left shoulder  Examination: Inspection of the shoulder shows that there is ecchymosis of the skin proximal humerus down into the upper arm. Tenderness at the proximal humerus and fracture site is noted.  Range of motion examination is deferred because of pain.  Motor exam hand wrist elbow normal including normal muscle tone.  Shoulder stability tests deferred because of fracture, elbow stable wrist stable.  Neurovascular examination is intact and the lymph nodes in the axilla and supraclavicular regions are normal   The opposite shoulder has no swelling, normal range of motion, no joint contracture subluxation atrophy tremor or skin lesion. Neurovascular exam is intact.   MEDICAL DECISION SECTION  xrays ordered?  None  My independent reading of xrays: Hospital films show proximal humerus fracture nondisplaced    Assessment    Encounter Diagnosis  Name Primary?  . Other closed nondisplaced fracture of proximal end of left humerus, initial encounter Yes    Left proximal humerus fracture    Plan    Sling-and-swathe for 1 additional week, followed by x-ray of the shoulder  No orders of the defined types were placed in this encounter.

## 2019-03-30 ENCOUNTER — Ambulatory Visit (INDEPENDENT_AMBULATORY_CARE_PROVIDER_SITE_OTHER): Payer: Medicare Other | Admitting: Orthopedic Surgery

## 2019-03-30 ENCOUNTER — Ambulatory Visit (INDEPENDENT_AMBULATORY_CARE_PROVIDER_SITE_OTHER): Payer: Medicare Other

## 2019-03-30 ENCOUNTER — Other Ambulatory Visit: Payer: Self-pay

## 2019-03-30 VITALS — Temp 98.4°F | Ht 59.0 in | Wt 122.0 lb

## 2019-03-30 DIAGNOSIS — S42295A Other nondisplaced fracture of upper end of left humerus, initial encounter for closed fracture: Secondary | ICD-10-CM

## 2019-03-30 NOTE — Progress Notes (Signed)
Patient ID: Kathy Ford, female   DOB: [birth year], 85 y.o.   MRN: [id number]  FRACTURE CARE   Chief Complaint  Patient presents with  . Follow-up    Recheck on left shoulder, humerus fracture, DOI 03-15-19.    Encounter Diagnosis  Name Primary?  . Other closed nondisplaced fracture of proximal end of left humerus, initial encounter 03/15/19 Yes    CURRENT TREATMENT : Sling POST INJURY DAY: 15 GLOBAL PERIOD DAY 7/90  X-ray stable proximal humerus fracture mild apex anterior angulation  Remove sling  Start therapy   Follow-up 6 weeks

## 2019-03-30 NOTE — Addendum Note (Signed)
Addended byCandice Camp on: 03/30/2019 03:10 PM   Modules accepted: Orders

## 2019-04-03 DIAGNOSIS — M25512 Pain in left shoulder: Secondary | ICD-10-CM | POA: Diagnosis not present

## 2019-04-03 DIAGNOSIS — M6281 Muscle weakness (generalized): Secondary | ICD-10-CM | POA: Diagnosis not present

## 2019-04-03 DIAGNOSIS — M25612 Stiffness of left shoulder, not elsewhere classified: Secondary | ICD-10-CM | POA: Diagnosis not present

## 2019-04-03 DIAGNOSIS — S42202D Unspecified fracture of upper end of left humerus, subsequent encounter for fracture with routine healing: Secondary | ICD-10-CM | POA: Diagnosis not present

## 2019-04-06 DIAGNOSIS — M25612 Stiffness of left shoulder, not elsewhere classified: Secondary | ICD-10-CM | POA: Diagnosis not present

## 2019-04-06 DIAGNOSIS — S42202D Unspecified fracture of upper end of left humerus, subsequent encounter for fracture with routine healing: Secondary | ICD-10-CM | POA: Diagnosis not present

## 2019-04-06 DIAGNOSIS — M25512 Pain in left shoulder: Secondary | ICD-10-CM | POA: Diagnosis not present

## 2019-04-06 DIAGNOSIS — M6281 Muscle weakness (generalized): Secondary | ICD-10-CM | POA: Diagnosis not present

## 2019-04-07 DIAGNOSIS — M6281 Muscle weakness (generalized): Secondary | ICD-10-CM | POA: Diagnosis not present

## 2019-04-07 DIAGNOSIS — M25612 Stiffness of left shoulder, not elsewhere classified: Secondary | ICD-10-CM | POA: Diagnosis not present

## 2019-04-07 DIAGNOSIS — M25512 Pain in left shoulder: Secondary | ICD-10-CM | POA: Diagnosis not present

## 2019-04-07 DIAGNOSIS — S42202D Unspecified fracture of upper end of left humerus, subsequent encounter for fracture with routine healing: Secondary | ICD-10-CM | POA: Diagnosis not present

## 2019-04-09 DIAGNOSIS — M6281 Muscle weakness (generalized): Secondary | ICD-10-CM | POA: Diagnosis not present

## 2019-04-09 DIAGNOSIS — S42202D Unspecified fracture of upper end of left humerus, subsequent encounter for fracture with routine healing: Secondary | ICD-10-CM | POA: Diagnosis not present

## 2019-04-09 DIAGNOSIS — M25512 Pain in left shoulder: Secondary | ICD-10-CM | POA: Diagnosis not present

## 2019-04-09 DIAGNOSIS — M25612 Stiffness of left shoulder, not elsewhere classified: Secondary | ICD-10-CM | POA: Diagnosis not present

## 2019-04-13 DIAGNOSIS — M25512 Pain in left shoulder: Secondary | ICD-10-CM | POA: Diagnosis not present

## 2019-04-13 DIAGNOSIS — M25612 Stiffness of left shoulder, not elsewhere classified: Secondary | ICD-10-CM | POA: Diagnosis not present

## 2019-04-13 DIAGNOSIS — S42202D Unspecified fracture of upper end of left humerus, subsequent encounter for fracture with routine healing: Secondary | ICD-10-CM | POA: Diagnosis not present

## 2019-04-13 DIAGNOSIS — M6281 Muscle weakness (generalized): Secondary | ICD-10-CM | POA: Diagnosis not present

## 2019-04-14 DIAGNOSIS — M25612 Stiffness of left shoulder, not elsewhere classified: Secondary | ICD-10-CM | POA: Diagnosis not present

## 2019-04-14 DIAGNOSIS — M25512 Pain in left shoulder: Secondary | ICD-10-CM | POA: Diagnosis not present

## 2019-04-14 DIAGNOSIS — S42202D Unspecified fracture of upper end of left humerus, subsequent encounter for fracture with routine healing: Secondary | ICD-10-CM | POA: Diagnosis not present

## 2019-04-14 DIAGNOSIS — M6281 Muscle weakness (generalized): Secondary | ICD-10-CM | POA: Diagnosis not present

## 2019-04-15 DIAGNOSIS — M25612 Stiffness of left shoulder, not elsewhere classified: Secondary | ICD-10-CM | POA: Diagnosis not present

## 2019-04-15 DIAGNOSIS — M25512 Pain in left shoulder: Secondary | ICD-10-CM | POA: Diagnosis not present

## 2019-04-15 DIAGNOSIS — M6281 Muscle weakness (generalized): Secondary | ICD-10-CM | POA: Diagnosis not present

## 2019-04-15 DIAGNOSIS — S42202D Unspecified fracture of upper end of left humerus, subsequent encounter for fracture with routine healing: Secondary | ICD-10-CM | POA: Diagnosis not present

## 2019-04-21 DIAGNOSIS — M25612 Stiffness of left shoulder, not elsewhere classified: Secondary | ICD-10-CM | POA: Diagnosis not present

## 2019-04-21 DIAGNOSIS — M25512 Pain in left shoulder: Secondary | ICD-10-CM | POA: Diagnosis not present

## 2019-04-21 DIAGNOSIS — S42202D Unspecified fracture of upper end of left humerus, subsequent encounter for fracture with routine healing: Secondary | ICD-10-CM | POA: Diagnosis not present

## 2019-04-21 DIAGNOSIS — M6281 Muscle weakness (generalized): Secondary | ICD-10-CM | POA: Diagnosis not present

## 2019-04-22 DIAGNOSIS — M6281 Muscle weakness (generalized): Secondary | ICD-10-CM | POA: Diagnosis not present

## 2019-04-22 DIAGNOSIS — M25512 Pain in left shoulder: Secondary | ICD-10-CM | POA: Diagnosis not present

## 2019-04-22 DIAGNOSIS — M25612 Stiffness of left shoulder, not elsewhere classified: Secondary | ICD-10-CM | POA: Diagnosis not present

## 2019-04-22 DIAGNOSIS — S42202D Unspecified fracture of upper end of left humerus, subsequent encounter for fracture with routine healing: Secondary | ICD-10-CM | POA: Diagnosis not present

## 2019-04-24 DIAGNOSIS — M25512 Pain in left shoulder: Secondary | ICD-10-CM | POA: Diagnosis not present

## 2019-04-24 DIAGNOSIS — M25612 Stiffness of left shoulder, not elsewhere classified: Secondary | ICD-10-CM | POA: Diagnosis not present

## 2019-04-24 DIAGNOSIS — M6281 Muscle weakness (generalized): Secondary | ICD-10-CM | POA: Diagnosis not present

## 2019-04-24 DIAGNOSIS — S42202D Unspecified fracture of upper end of left humerus, subsequent encounter for fracture with routine healing: Secondary | ICD-10-CM | POA: Diagnosis not present

## 2019-04-29 DIAGNOSIS — M25512 Pain in left shoulder: Secondary | ICD-10-CM | POA: Diagnosis not present

## 2019-04-29 DIAGNOSIS — S42202D Unspecified fracture of upper end of left humerus, subsequent encounter for fracture with routine healing: Secondary | ICD-10-CM | POA: Diagnosis not present

## 2019-04-29 DIAGNOSIS — M25612 Stiffness of left shoulder, not elsewhere classified: Secondary | ICD-10-CM | POA: Diagnosis not present

## 2019-04-29 DIAGNOSIS — M6281 Muscle weakness (generalized): Secondary | ICD-10-CM | POA: Diagnosis not present

## 2019-05-01 DIAGNOSIS — M25612 Stiffness of left shoulder, not elsewhere classified: Secondary | ICD-10-CM | POA: Diagnosis not present

## 2019-05-01 DIAGNOSIS — S42202D Unspecified fracture of upper end of left humerus, subsequent encounter for fracture with routine healing: Secondary | ICD-10-CM | POA: Diagnosis not present

## 2019-05-01 DIAGNOSIS — M6281 Muscle weakness (generalized): Secondary | ICD-10-CM | POA: Diagnosis not present

## 2019-05-01 DIAGNOSIS — M25512 Pain in left shoulder: Secondary | ICD-10-CM | POA: Diagnosis not present

## 2019-05-04 DIAGNOSIS — M6281 Muscle weakness (generalized): Secondary | ICD-10-CM | POA: Diagnosis not present

## 2019-05-04 DIAGNOSIS — M25612 Stiffness of left shoulder, not elsewhere classified: Secondary | ICD-10-CM | POA: Diagnosis not present

## 2019-05-04 DIAGNOSIS — M25512 Pain in left shoulder: Secondary | ICD-10-CM | POA: Diagnosis not present

## 2019-05-04 DIAGNOSIS — S42202D Unspecified fracture of upper end of left humerus, subsequent encounter for fracture with routine healing: Secondary | ICD-10-CM | POA: Diagnosis not present

## 2019-05-07 DIAGNOSIS — M25512 Pain in left shoulder: Secondary | ICD-10-CM | POA: Diagnosis not present

## 2019-05-07 DIAGNOSIS — S42202D Unspecified fracture of upper end of left humerus, subsequent encounter for fracture with routine healing: Secondary | ICD-10-CM | POA: Diagnosis not present

## 2019-05-07 DIAGNOSIS — M6281 Muscle weakness (generalized): Secondary | ICD-10-CM | POA: Diagnosis not present

## 2019-05-07 DIAGNOSIS — M25612 Stiffness of left shoulder, not elsewhere classified: Secondary | ICD-10-CM | POA: Diagnosis not present

## 2019-05-08 DIAGNOSIS — S42202D Unspecified fracture of upper end of left humerus, subsequent encounter for fracture with routine healing: Secondary | ICD-10-CM | POA: Diagnosis not present

## 2019-05-08 DIAGNOSIS — M6281 Muscle weakness (generalized): Secondary | ICD-10-CM | POA: Diagnosis not present

## 2019-05-08 DIAGNOSIS — M25612 Stiffness of left shoulder, not elsewhere classified: Secondary | ICD-10-CM | POA: Diagnosis not present

## 2019-05-08 DIAGNOSIS — M25512 Pain in left shoulder: Secondary | ICD-10-CM | POA: Diagnosis not present

## 2019-05-11 ENCOUNTER — Ambulatory Visit (INDEPENDENT_AMBULATORY_CARE_PROVIDER_SITE_OTHER): Payer: Medicare Other | Admitting: Orthopedic Surgery

## 2019-05-11 ENCOUNTER — Other Ambulatory Visit: Payer: Self-pay

## 2019-05-11 VITALS — BP 130/76 | HR 78 | Temp 97.4°F | Ht 59.0 in | Wt 120.0 lb

## 2019-05-11 DIAGNOSIS — S42295D Other nondisplaced fracture of upper end of left humerus, subsequent encounter for fracture with routine healing: Secondary | ICD-10-CM

## 2019-05-11 NOTE — Progress Notes (Signed)
Chief Complaint  Patient presents with  . Follow-up    Recheck on left proximal humerus fracture, DOI 03-15-19.   85 years old left proximal humerus fracture with 2 months out she is completed her therapy she is a little stiff in flexion but below 90 degrees she has excellent motion no pain I am releasing her to follow-up as needed   Encounter Diagnosis  Name Primary?  . Other closed nondisplaced fracture of proximal end of left humerus with routine healing, subsequent encounter6/7/20 Yes

## 2019-05-12 DIAGNOSIS — S42202D Unspecified fracture of upper end of left humerus, subsequent encounter for fracture with routine healing: Secondary | ICD-10-CM | POA: Diagnosis not present

## 2019-05-12 DIAGNOSIS — M6281 Muscle weakness (generalized): Secondary | ICD-10-CM | POA: Diagnosis not present

## 2019-05-12 DIAGNOSIS — M25612 Stiffness of left shoulder, not elsewhere classified: Secondary | ICD-10-CM | POA: Diagnosis not present

## 2019-05-12 DIAGNOSIS — M25512 Pain in left shoulder: Secondary | ICD-10-CM | POA: Diagnosis not present

## 2019-05-14 DIAGNOSIS — S42202D Unspecified fracture of upper end of left humerus, subsequent encounter for fracture with routine healing: Secondary | ICD-10-CM | POA: Diagnosis not present

## 2019-05-14 DIAGNOSIS — M6281 Muscle weakness (generalized): Secondary | ICD-10-CM | POA: Diagnosis not present

## 2019-05-14 DIAGNOSIS — M25512 Pain in left shoulder: Secondary | ICD-10-CM | POA: Diagnosis not present

## 2019-05-14 DIAGNOSIS — M25612 Stiffness of left shoulder, not elsewhere classified: Secondary | ICD-10-CM | POA: Diagnosis not present

## 2019-05-18 DIAGNOSIS — M6281 Muscle weakness (generalized): Secondary | ICD-10-CM | POA: Diagnosis not present

## 2019-05-18 DIAGNOSIS — M25512 Pain in left shoulder: Secondary | ICD-10-CM | POA: Diagnosis not present

## 2019-05-18 DIAGNOSIS — M25612 Stiffness of left shoulder, not elsewhere classified: Secondary | ICD-10-CM | POA: Diagnosis not present

## 2019-05-18 DIAGNOSIS — S42202D Unspecified fracture of upper end of left humerus, subsequent encounter for fracture with routine healing: Secondary | ICD-10-CM | POA: Diagnosis not present

## 2019-05-21 DIAGNOSIS — L600 Ingrowing nail: Secondary | ICD-10-CM | POA: Diagnosis not present

## 2019-05-21 DIAGNOSIS — M79671 Pain in right foot: Secondary | ICD-10-CM | POA: Diagnosis not present

## 2019-05-21 DIAGNOSIS — I739 Peripheral vascular disease, unspecified: Secondary | ICD-10-CM | POA: Diagnosis not present

## 2019-05-21 DIAGNOSIS — M79672 Pain in left foot: Secondary | ICD-10-CM | POA: Diagnosis not present

## 2019-05-27 ENCOUNTER — Telehealth: Payer: Self-pay | Admitting: Obstetrics & Gynecology

## 2019-05-27 NOTE — Telephone Encounter (Signed)
Tried reaching the patient to remind her of appointment and restrictions.  Mailbox is full.

## 2019-05-27 NOTE — Telephone Encounter (Signed)

## 2019-05-28 ENCOUNTER — Encounter: Payer: Self-pay | Admitting: Obstetrics & Gynecology

## 2019-05-28 ENCOUNTER — Other Ambulatory Visit: Payer: Self-pay

## 2019-05-28 ENCOUNTER — Ambulatory Visit (INDEPENDENT_AMBULATORY_CARE_PROVIDER_SITE_OTHER): Payer: Medicare Other | Admitting: Obstetrics & Gynecology

## 2019-05-28 VITALS — BP 164/72 | HR 78 | Ht 59.0 in | Wt 122.0 lb

## 2019-05-28 DIAGNOSIS — N8111 Cystocele, midline: Secondary | ICD-10-CM | POA: Diagnosis not present

## 2019-05-28 DIAGNOSIS — Z4689 Encounter for fitting and adjustment of other specified devices: Secondary | ICD-10-CM

## 2019-05-28 NOTE — Progress Notes (Signed)
Chief Complaint  Patient presents with  . Follow-up    Blood pressure (!) 164/72, pulse 78, height 4\' 11"  (1.499 m), weight 122 lb (55.3 kg).  Kathy Ford presents today for routine follow up related to her pessary.   She uses a Milex ring with support #2 She reports no vaginal discharge or vaginal bleeding.  Exam reveals no undue vaginal mucosal pressure of breakdown, no discharge and no vaginal bleeding.  The pessary is removed, cleaned and replaced without difficulty.    Kathy Ford will be sen back in 4 months for continued follow up.  Florian Buff, MD  05/28/2019 4:25 PM

## 2019-06-01 DIAGNOSIS — Z0000 Encounter for general adult medical examination without abnormal findings: Secondary | ICD-10-CM | POA: Diagnosis not present

## 2019-06-19 DIAGNOSIS — R609 Edema, unspecified: Secondary | ICD-10-CM | POA: Diagnosis not present

## 2019-06-19 DIAGNOSIS — E785 Hyperlipidemia, unspecified: Secondary | ICD-10-CM | POA: Diagnosis not present

## 2019-06-19 DIAGNOSIS — Z0000 Encounter for general adult medical examination without abnormal findings: Secondary | ICD-10-CM | POA: Diagnosis not present

## 2019-06-19 DIAGNOSIS — Z634 Disappearance and death of family member: Secondary | ICD-10-CM | POA: Diagnosis not present

## 2019-06-19 DIAGNOSIS — M1990 Unspecified osteoarthritis, unspecified site: Secondary | ICD-10-CM | POA: Diagnosis not present

## 2019-06-19 DIAGNOSIS — D649 Anemia, unspecified: Secondary | ICD-10-CM | POA: Diagnosis not present

## 2019-06-19 DIAGNOSIS — H409 Unspecified glaucoma: Secondary | ICD-10-CM | POA: Diagnosis not present

## 2019-06-19 DIAGNOSIS — K210 Gastro-esophageal reflux disease with esophagitis: Secondary | ICD-10-CM | POA: Diagnosis not present

## 2019-06-19 DIAGNOSIS — I10 Essential (primary) hypertension: Secondary | ICD-10-CM | POA: Diagnosis not present

## 2019-06-19 LAB — CMP 10231
ALT: 9 (ref 3–30)
AST: 16
Albumin/Globulin Ratio: 1.2
Albumin: 4.2
Alkaline Phosphatase: 71
BUN/Creatinine Ratio: 29
BUN: 20 (ref 4–21)
Calcium: 9.7
Carbon Dioxide, Total: 26
Chloride: 102
Creat: 0.7
EGFR (African American): 90
EGFR (Non-African Amer.): 78
Globulin, Total: 3.4
Glucose: 93
Potassium: 3.8
Sodium: 143
Total Bilirubin: 0.5
Total Protein: 7.6 (ref 6.4–8.2)

## 2019-06-20 LAB — CBC WITH DIFF/PLATELET
BASO(ABSOLUTE): 0.1
Basophils: 1
Eosinophils Absolute: 0
Eosinophils, %: 2
HCT: 35 (ref 29–41)
Hemoglobin: 11.7
Immature Granulocytes: 0
Lymphs Abs: 3.2
MCH: 31.9
MCHC: 33.6
MCV: 95 (ref 76–111)
Monocytes(Absolute): 0.7
Monocytes: 8
Neutro Abs: 5.1
Neutrophils: 55
RBC: 3.67 — AB (ref 3.87–5.11)
RDW: 12.1
WBC: 9.2
lymph#: 34
platelet count: 313

## 2019-06-20 LAB — URINALYSIS
Bilirubin: NEGATIVE
Glucose: NEGATIVE
Ketones: NEGATIVE
Nitrite: NEGATIVE
Specific Gravity: 1.019
Urobilinogen, Ur: 0.2
pH: 5

## 2019-06-20 LAB — LIPID PANEL
Cholesterol, Total: 179
HDL Cholesterol: 55 (ref 35–70)
LDL Cholesterol: 113
Triglycerides: 57 (ref 40–160)
VLDL Cholesterol Cal: 11

## 2019-06-20 LAB — TSH: TSH: 2.13

## 2019-07-07 DIAGNOSIS — N390 Urinary tract infection, site not specified: Secondary | ICD-10-CM | POA: Diagnosis not present

## 2019-07-14 ENCOUNTER — Telehealth: Payer: Self-pay | Admitting: *Deleted

## 2019-07-14 NOTE — Telephone Encounter (Signed)
Pt left message stating that she has been referred to a urologist but they can't see her till November 6. Wants to see if she can get a urine test done here because she is having blood in urine.

## 2019-07-14 NOTE — Telephone Encounter (Signed)
We could do a culture I suppose

## 2019-07-20 ENCOUNTER — Ambulatory Visit (INDEPENDENT_AMBULATORY_CARE_PROVIDER_SITE_OTHER): Payer: Medicare Other | Admitting: Otolaryngology

## 2019-07-20 ENCOUNTER — Other Ambulatory Visit: Payer: Self-pay

## 2019-07-20 DIAGNOSIS — H903 Sensorineural hearing loss, bilateral: Secondary | ICD-10-CM

## 2019-07-20 DIAGNOSIS — H838X3 Other specified diseases of inner ear, bilateral: Secondary | ICD-10-CM | POA: Diagnosis not present

## 2019-07-20 DIAGNOSIS — H6122 Impacted cerumen, left ear: Secondary | ICD-10-CM | POA: Diagnosis not present

## 2019-07-28 DIAGNOSIS — R310 Gross hematuria: Secondary | ICD-10-CM | POA: Diagnosis not present

## 2019-07-28 DIAGNOSIS — R8279 Other abnormal findings on microbiological examination of urine: Secondary | ICD-10-CM | POA: Diagnosis not present

## 2019-08-10 ENCOUNTER — Telehealth: Payer: Self-pay | Admitting: Obstetrics & Gynecology

## 2019-08-10 NOTE — Telephone Encounter (Signed)

## 2019-08-11 ENCOUNTER — Ambulatory Visit (INDEPENDENT_AMBULATORY_CARE_PROVIDER_SITE_OTHER): Payer: Medicare Other | Admitting: Obstetrics & Gynecology

## 2019-08-11 ENCOUNTER — Other Ambulatory Visit: Payer: Self-pay

## 2019-08-11 ENCOUNTER — Encounter: Payer: Self-pay | Admitting: Obstetrics & Gynecology

## 2019-08-11 VITALS — BP 152/74 | HR 69 | Ht 59.0 in | Wt 119.0 lb

## 2019-08-11 DIAGNOSIS — N8111 Cystocele, midline: Secondary | ICD-10-CM

## 2019-08-11 DIAGNOSIS — Z4689 Encounter for fitting and adjustment of other specified devices: Secondary | ICD-10-CM | POA: Diagnosis not present

## 2019-08-11 NOTE — Progress Notes (Signed)
Chief Complaint  Patient presents with  . Pessary cleaning    Blood pressure (!) 152/74, pulse 69, height 4\' 11"  (1.499 m), weight 119 lb (54 kg).  [name] presents today for routine follow up related to her pessary.   She uses a Milex ring with support #2 She reports no vaginal discharge or vaginal bleeding.  Exam reveals no undue vaginal mucosal pressure of breakdown, no discharge and no vaginal bleeding.  The pessary is removed, cleaned and replaced without difficulty.    Kathy Ford will be sen back in 4 months for continued follow up.  Florian Buff, MD  08/11/2019 1:56 PM

## 2019-08-13 DIAGNOSIS — N3000 Acute cystitis without hematuria: Secondary | ICD-10-CM | POA: Diagnosis not present

## 2019-08-13 DIAGNOSIS — R310 Gross hematuria: Secondary | ICD-10-CM | POA: Diagnosis not present

## 2019-08-17 DIAGNOSIS — M79671 Pain in right foot: Secondary | ICD-10-CM | POA: Diagnosis not present

## 2019-08-17 DIAGNOSIS — L600 Ingrowing nail: Secondary | ICD-10-CM | POA: Diagnosis not present

## 2019-08-17 DIAGNOSIS — M79672 Pain in left foot: Secondary | ICD-10-CM | POA: Diagnosis not present

## 2019-08-17 DIAGNOSIS — I739 Peripheral vascular disease, unspecified: Secondary | ICD-10-CM | POA: Diagnosis not present

## 2019-08-24 DIAGNOSIS — H26492 Other secondary cataract, left eye: Secondary | ICD-10-CM | POA: Diagnosis not present

## 2019-08-24 DIAGNOSIS — H401132 Primary open-angle glaucoma, bilateral, moderate stage: Secondary | ICD-10-CM | POA: Diagnosis not present

## 2019-09-10 DIAGNOSIS — R310 Gross hematuria: Secondary | ICD-10-CM | POA: Diagnosis not present

## 2019-09-15 DIAGNOSIS — R319 Hematuria, unspecified: Secondary | ICD-10-CM | POA: Diagnosis not present

## 2019-09-15 DIAGNOSIS — I10 Essential (primary) hypertension: Secondary | ICD-10-CM | POA: Diagnosis not present

## 2019-09-15 DIAGNOSIS — K219 Gastro-esophageal reflux disease without esophagitis: Secondary | ICD-10-CM | POA: Diagnosis not present

## 2019-09-15 DIAGNOSIS — M1990 Unspecified osteoarthritis, unspecified site: Secondary | ICD-10-CM | POA: Diagnosis not present

## 2019-09-17 DIAGNOSIS — R310 Gross hematuria: Secondary | ICD-10-CM | POA: Diagnosis not present

## 2019-09-17 DIAGNOSIS — N200 Calculus of kidney: Secondary | ICD-10-CM | POA: Diagnosis not present

## 2019-09-28 ENCOUNTER — Ambulatory Visit: Payer: Medicare Other | Admitting: Obstetrics & Gynecology

## 2019-12-09 ENCOUNTER — Telehealth: Payer: Self-pay | Admitting: Obstetrics & Gynecology

## 2019-12-09 NOTE — Telephone Encounter (Signed)
Tried to reach the patient to remind her of her appointment/restrictions, mb is full.

## 2019-12-10 ENCOUNTER — Ambulatory Visit (INDEPENDENT_AMBULATORY_CARE_PROVIDER_SITE_OTHER): Payer: Medicare Other | Admitting: Obstetrics & Gynecology

## 2019-12-10 ENCOUNTER — Other Ambulatory Visit: Payer: Self-pay

## 2019-12-10 ENCOUNTER — Encounter: Payer: Self-pay | Admitting: Obstetrics & Gynecology

## 2019-12-10 VITALS — BP 167/74 | HR 72 | Ht 59.0 in | Wt 117.0 lb

## 2019-12-10 DIAGNOSIS — N8111 Cystocele, midline: Secondary | ICD-10-CM

## 2019-12-10 DIAGNOSIS — Z4689 Encounter for fitting and adjustment of other specified devices: Secondary | ICD-10-CM | POA: Diagnosis not present

## 2019-12-10 NOTE — Progress Notes (Signed)
Chief Complaint  Patient presents with  . Pessary Check    Blood pressure (!) 167/74, pulse 72, height 4\' 11"  (1.499 m), weight 117 lb (53.1 kg).  Kathy Ford presents today for routine follow up related to her pessary.   She uses a Milex ring with support #2 She reports no vaginal discharge or vaginal bleeding.  Exam reveals no undue vaginal mucosal pressure of breakdown, no discharge and no vaginal bleeding.  The pessary is removed, cleaned and replaced without difficulty.    Kathy Ford will be sen back in 4 months for continued follow up.  Florian Buff, MD  12/10/2019 2:14 PM

## 2019-12-18 DIAGNOSIS — Z961 Presence of intraocular lens: Secondary | ICD-10-CM | POA: Diagnosis not present

## 2019-12-18 DIAGNOSIS — H401132 Primary open-angle glaucoma, bilateral, moderate stage: Secondary | ICD-10-CM | POA: Diagnosis not present

## 2019-12-18 DIAGNOSIS — H524 Presbyopia: Secondary | ICD-10-CM | POA: Diagnosis not present

## 2020-01-13 DIAGNOSIS — D692 Other nonthrombocytopenic purpura: Secondary | ICD-10-CM | POA: Diagnosis not present

## 2020-01-13 DIAGNOSIS — I10 Essential (primary) hypertension: Secondary | ICD-10-CM | POA: Diagnosis not present

## 2020-01-13 DIAGNOSIS — Z299 Encounter for prophylactic measures, unspecified: Secondary | ICD-10-CM | POA: Diagnosis not present

## 2020-01-13 DIAGNOSIS — D414 Neoplasm of uncertain behavior of bladder: Secondary | ICD-10-CM | POA: Diagnosis not present

## 2020-01-18 DIAGNOSIS — L600 Ingrowing nail: Secondary | ICD-10-CM | POA: Diagnosis not present

## 2020-01-18 DIAGNOSIS — M79671 Pain in right foot: Secondary | ICD-10-CM | POA: Diagnosis not present

## 2020-01-18 DIAGNOSIS — I739 Peripheral vascular disease, unspecified: Secondary | ICD-10-CM | POA: Diagnosis not present

## 2020-01-18 DIAGNOSIS — M79672 Pain in left foot: Secondary | ICD-10-CM | POA: Diagnosis not present

## 2020-01-19 ENCOUNTER — Ambulatory Visit: Payer: Medicare Other | Admitting: Family Medicine

## 2020-02-08 DIAGNOSIS — H16223 Keratoconjunctivitis sicca, not specified as Sjogren's, bilateral: Secondary | ICD-10-CM | POA: Diagnosis not present

## 2020-02-08 DIAGNOSIS — Z961 Presence of intraocular lens: Secondary | ICD-10-CM | POA: Diagnosis not present

## 2020-02-08 DIAGNOSIS — H01001 Unspecified blepharitis right upper eyelid: Secondary | ICD-10-CM | POA: Diagnosis not present

## 2020-02-08 DIAGNOSIS — H01005 Unspecified blepharitis left lower eyelid: Secondary | ICD-10-CM | POA: Diagnosis not present

## 2020-02-08 DIAGNOSIS — H01004 Unspecified blepharitis left upper eyelid: Secondary | ICD-10-CM | POA: Diagnosis not present

## 2020-02-08 DIAGNOSIS — H401132 Primary open-angle glaucoma, bilateral, moderate stage: Secondary | ICD-10-CM | POA: Diagnosis not present

## 2020-02-08 DIAGNOSIS — H01002 Unspecified blepharitis right lower eyelid: Secondary | ICD-10-CM | POA: Diagnosis not present

## 2020-02-22 ENCOUNTER — Ambulatory Visit: Payer: Medicare Other | Admitting: Family Medicine

## 2020-02-24 DIAGNOSIS — K219 Gastro-esophageal reflux disease without esophagitis: Secondary | ICD-10-CM | POA: Diagnosis not present

## 2020-02-24 DIAGNOSIS — I10 Essential (primary) hypertension: Secondary | ICD-10-CM | POA: Diagnosis not present

## 2020-02-24 DIAGNOSIS — Z299 Encounter for prophylactic measures, unspecified: Secondary | ICD-10-CM | POA: Diagnosis not present

## 2020-02-24 DIAGNOSIS — R609 Edema, unspecified: Secondary | ICD-10-CM | POA: Diagnosis not present

## 2020-03-10 DIAGNOSIS — I10 Essential (primary) hypertension: Secondary | ICD-10-CM | POA: Diagnosis not present

## 2020-03-10 DIAGNOSIS — R609 Edema, unspecified: Secondary | ICD-10-CM | POA: Diagnosis not present

## 2020-03-10 DIAGNOSIS — R002 Palpitations: Secondary | ICD-10-CM | POA: Diagnosis not present

## 2020-03-10 DIAGNOSIS — Z299 Encounter for prophylactic measures, unspecified: Secondary | ICD-10-CM | POA: Diagnosis not present

## 2020-03-17 DIAGNOSIS — H01002 Unspecified blepharitis right lower eyelid: Secondary | ICD-10-CM | POA: Diagnosis not present

## 2020-03-17 DIAGNOSIS — H01005 Unspecified blepharitis left lower eyelid: Secondary | ICD-10-CM | POA: Diagnosis not present

## 2020-03-17 DIAGNOSIS — Z961 Presence of intraocular lens: Secondary | ICD-10-CM | POA: Diagnosis not present

## 2020-03-17 DIAGNOSIS — H16223 Keratoconjunctivitis sicca, not specified as Sjogren's, bilateral: Secondary | ICD-10-CM | POA: Diagnosis not present

## 2020-03-17 DIAGNOSIS — H01001 Unspecified blepharitis right upper eyelid: Secondary | ICD-10-CM | POA: Diagnosis not present

## 2020-03-17 DIAGNOSIS — H401132 Primary open-angle glaucoma, bilateral, moderate stage: Secondary | ICD-10-CM | POA: Diagnosis not present

## 2020-03-17 DIAGNOSIS — H01004 Unspecified blepharitis left upper eyelid: Secondary | ICD-10-CM | POA: Diagnosis not present

## 2020-03-21 DIAGNOSIS — R600 Localized edema: Secondary | ICD-10-CM | POA: Diagnosis not present

## 2020-04-12 ENCOUNTER — Other Ambulatory Visit: Payer: Self-pay

## 2020-04-12 ENCOUNTER — Encounter: Payer: Self-pay | Admitting: Obstetrics & Gynecology

## 2020-04-12 ENCOUNTER — Ambulatory Visit (INDEPENDENT_AMBULATORY_CARE_PROVIDER_SITE_OTHER): Payer: Medicare Other | Admitting: Obstetrics & Gynecology

## 2020-04-12 VITALS — BP 181/81 | HR 64 | Ht 59.0 in | Wt 118.5 lb

## 2020-04-12 DIAGNOSIS — Z4689 Encounter for fitting and adjustment of other specified devices: Secondary | ICD-10-CM | POA: Diagnosis not present

## 2020-04-12 DIAGNOSIS — N8111 Cystocele, midline: Secondary | ICD-10-CM | POA: Diagnosis not present

## 2020-04-12 NOTE — Progress Notes (Signed)
Chief Complaint  Patient presents with   Pessary Check    Blood pressure (!) 181/81, pulse 64, height 4\' 11"  (1.499 m), weight 118 lb 8 oz (53.8 kg).  Kathy Ford presents today for routine follow up related to her pessary.   She uses a Milex ring with support #2 She reports little vaginal discharge or vaginal bleeding.  Exam reveals no undue vaginal mucosal pressure of breakdown, no discharge and little vaginal bleeding.  The pessary is removed, cleaned and replaced without difficulty.    Kathy Ford will be sen back in 4 months for continued follow up.  Going to PCP for BP eval today or tomorrow  Florian Buff, MD  04/12/2020 2:05 PM

## 2020-04-18 DIAGNOSIS — L600 Ingrowing nail: Secondary | ICD-10-CM | POA: Diagnosis not present

## 2020-04-18 DIAGNOSIS — I739 Peripheral vascular disease, unspecified: Secondary | ICD-10-CM | POA: Diagnosis not present

## 2020-04-18 DIAGNOSIS — M79672 Pain in left foot: Secondary | ICD-10-CM | POA: Diagnosis not present

## 2020-04-18 DIAGNOSIS — M79671 Pain in right foot: Secondary | ICD-10-CM | POA: Diagnosis not present

## 2020-04-25 DIAGNOSIS — R002 Palpitations: Secondary | ICD-10-CM | POA: Diagnosis not present

## 2020-04-25 DIAGNOSIS — Z299 Encounter for prophylactic measures, unspecified: Secondary | ICD-10-CM | POA: Diagnosis not present

## 2020-04-25 DIAGNOSIS — R609 Edema, unspecified: Secondary | ICD-10-CM | POA: Diagnosis not present

## 2020-04-25 DIAGNOSIS — I10 Essential (primary) hypertension: Secondary | ICD-10-CM | POA: Diagnosis not present

## 2020-04-25 DIAGNOSIS — I471 Supraventricular tachycardia: Secondary | ICD-10-CM | POA: Diagnosis not present

## 2020-04-25 DIAGNOSIS — Z6820 Body mass index (BMI) 20.0-20.9, adult: Secondary | ICD-10-CM | POA: Diagnosis not present

## 2020-04-28 DIAGNOSIS — H401112 Primary open-angle glaucoma, right eye, moderate stage: Secondary | ICD-10-CM | POA: Diagnosis not present

## 2020-05-09 DIAGNOSIS — H401122 Primary open-angle glaucoma, left eye, moderate stage: Secondary | ICD-10-CM | POA: Diagnosis not present

## 2020-05-10 DIAGNOSIS — R0602 Shortness of breath: Secondary | ICD-10-CM | POA: Diagnosis not present

## 2020-05-10 DIAGNOSIS — K219 Gastro-esophageal reflux disease without esophagitis: Secondary | ICD-10-CM | POA: Diagnosis not present

## 2020-05-10 DIAGNOSIS — I517 Cardiomegaly: Secondary | ICD-10-CM | POA: Diagnosis not present

## 2020-05-10 DIAGNOSIS — R262 Difficulty in walking, not elsewhere classified: Secondary | ICD-10-CM | POA: Diagnosis not present

## 2020-05-10 DIAGNOSIS — Z299 Encounter for prophylactic measures, unspecified: Secondary | ICD-10-CM | POA: Diagnosis not present

## 2020-05-10 DIAGNOSIS — J90 Pleural effusion, not elsewhere classified: Secondary | ICD-10-CM | POA: Diagnosis not present

## 2020-05-10 DIAGNOSIS — I10 Essential (primary) hypertension: Secondary | ICD-10-CM | POA: Diagnosis not present

## 2020-05-10 DIAGNOSIS — J9811 Atelectasis: Secondary | ICD-10-CM | POA: Diagnosis not present

## 2020-05-20 IMAGING — DX LEFT SHOULDER - 2+ VIEW
2 series · 2 of 2 positions shown · non-contrast
Comparison: No priors.

CLINICAL DATA: 89-year-old female with history of fall injuring the
left shoulder on a table.

EXAM:
LEFT SHOULDER - 2+ VIEW

[shoulder grashey]
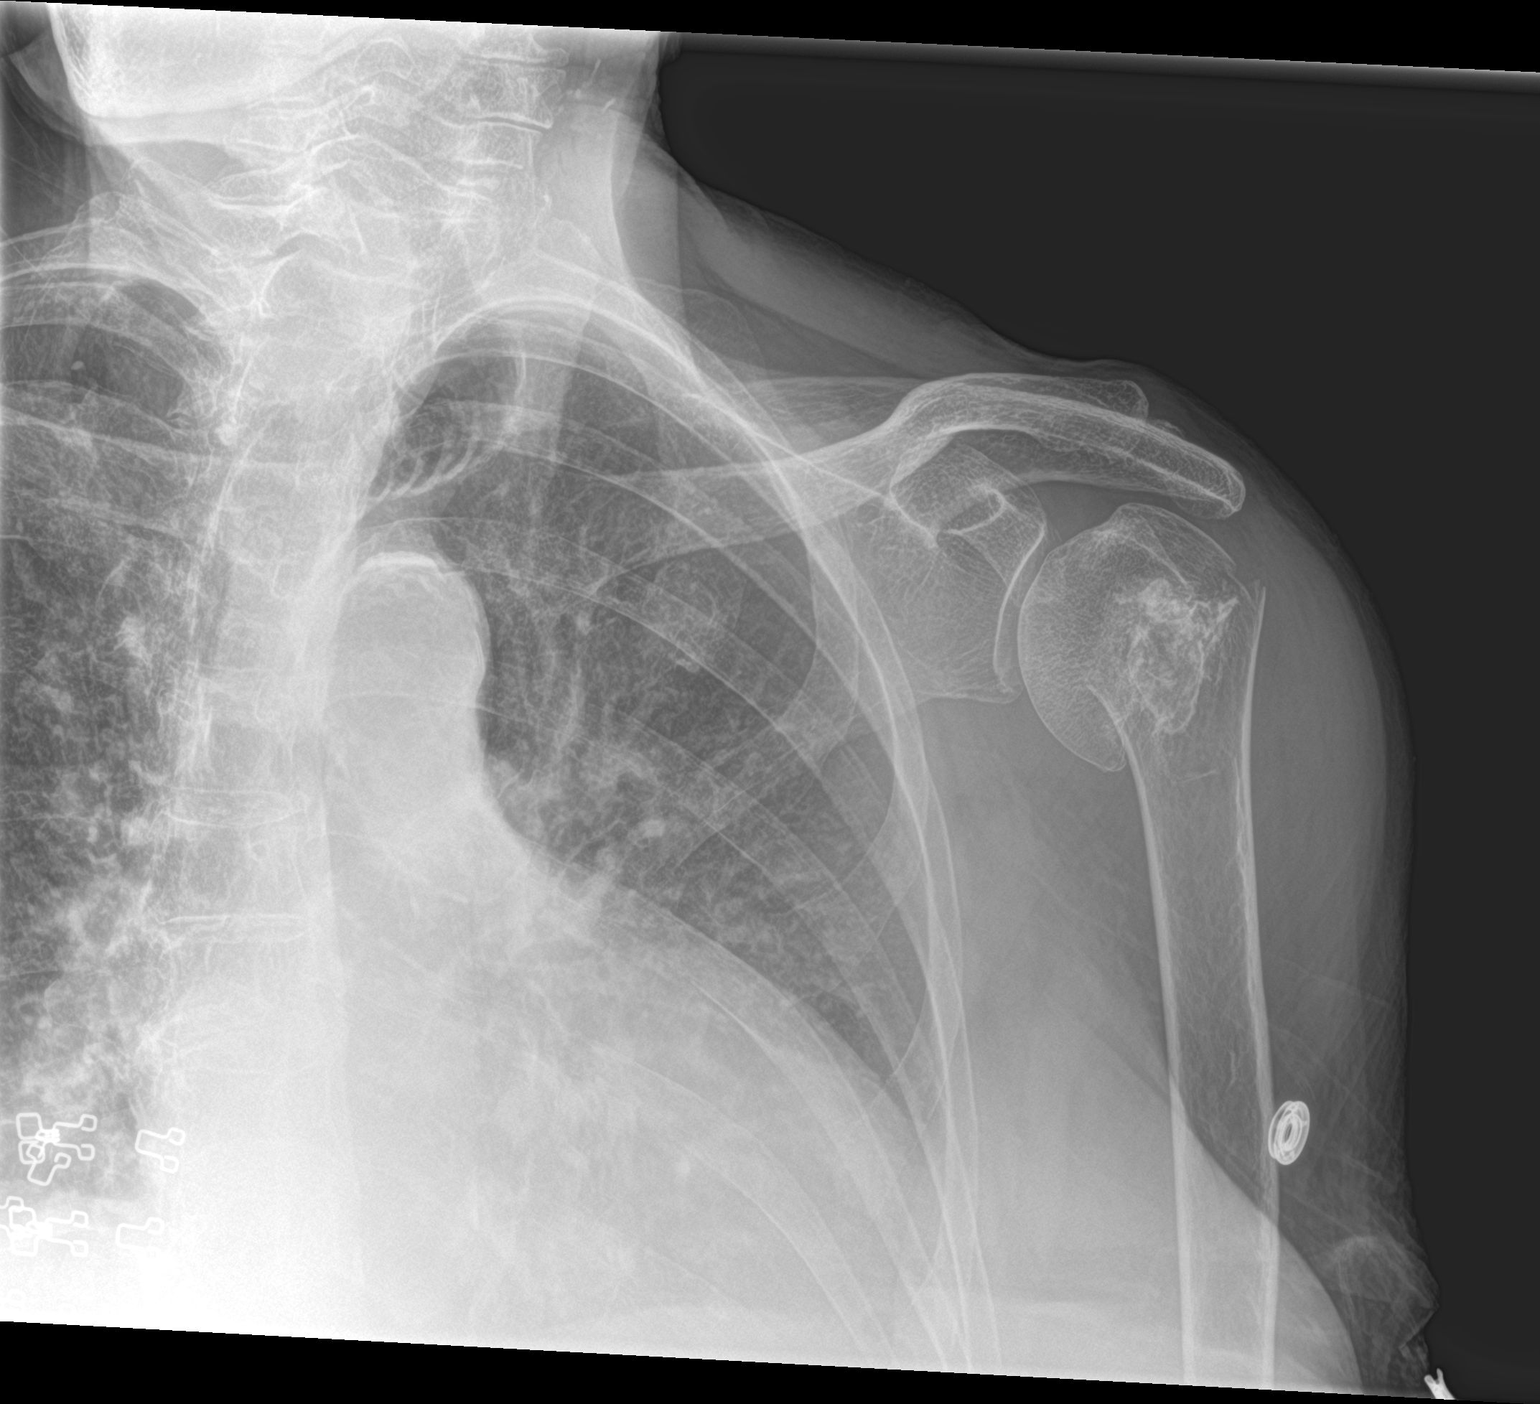

[shoulder y view]
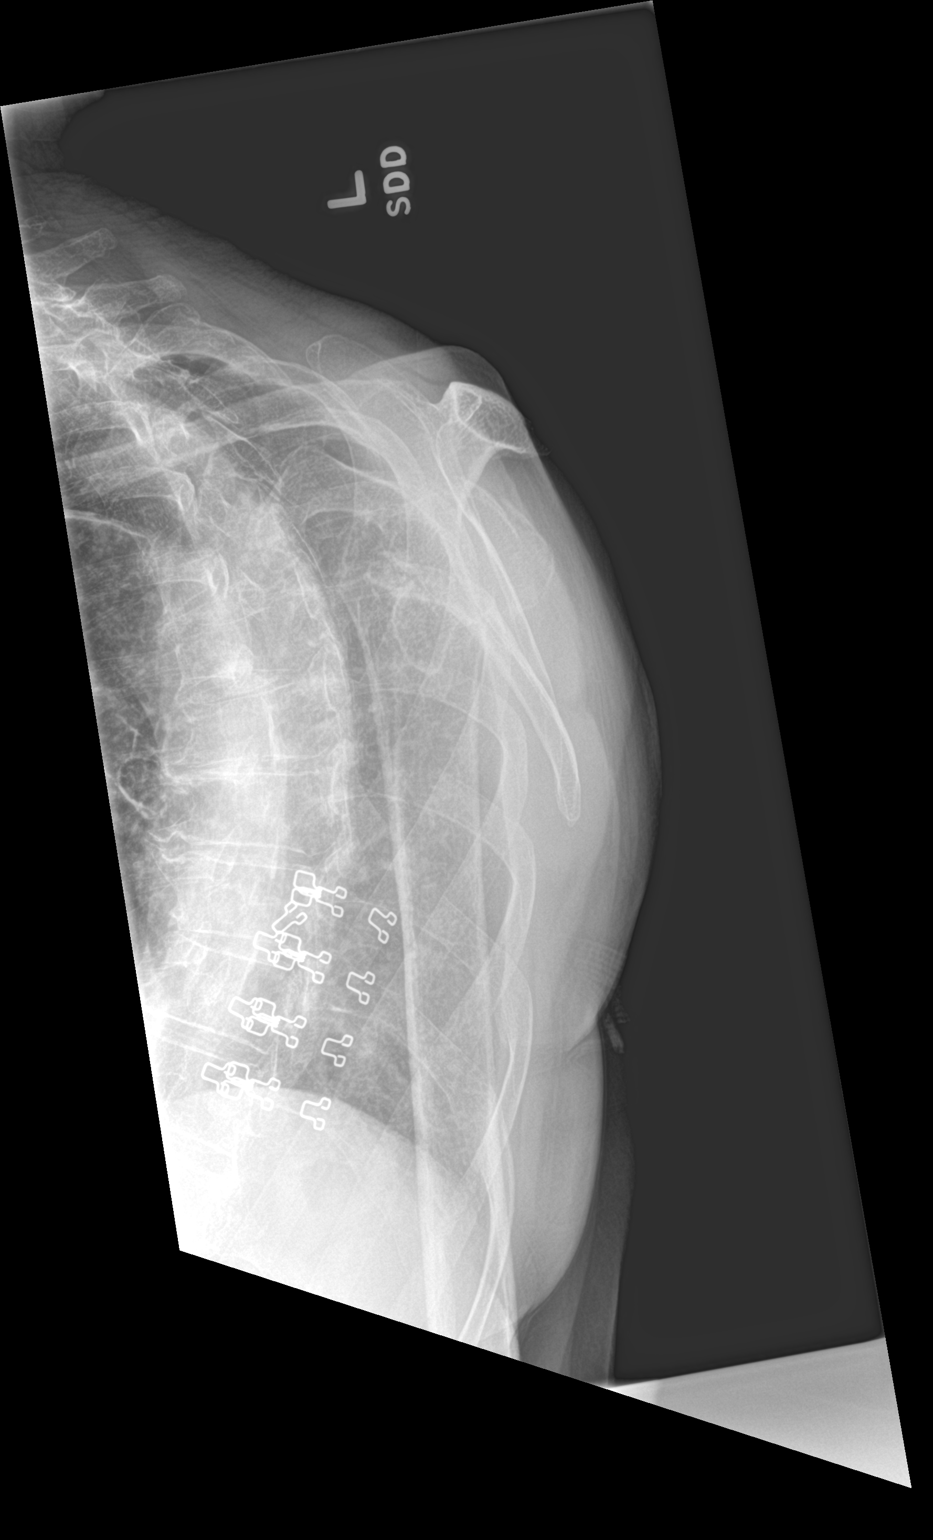

[2 of 2 positions shown; findings below may reference images not displayed]

FINDINGS: There is an acute mildly displaced fracture which appears mildly
impacted through the anatomic neck of the left proximal humerus.
This is poorly demonstrated on today's two view examination. The
possibility of mild comminution is considered. Humeral head appears
slightly rotated in the glenoid fossa.
IMPRESSION: 1. Acute mildly displaced likely mildly impacted fracture through
the anatomic neck of the left proximal humerus.

## 2020-05-24 DIAGNOSIS — I471 Supraventricular tachycardia: Secondary | ICD-10-CM | POA: Diagnosis not present

## 2020-05-24 DIAGNOSIS — I10 Essential (primary) hypertension: Secondary | ICD-10-CM | POA: Diagnosis not present

## 2020-05-24 DIAGNOSIS — M25531 Pain in right wrist: Secondary | ICD-10-CM | POA: Diagnosis not present

## 2020-05-24 DIAGNOSIS — Z299 Encounter for prophylactic measures, unspecified: Secondary | ICD-10-CM | POA: Diagnosis not present

## 2020-05-24 DIAGNOSIS — M1811 Unilateral primary osteoarthritis of first carpometacarpal joint, right hand: Secondary | ICD-10-CM | POA: Diagnosis not present

## 2020-05-24 DIAGNOSIS — M19031 Primary osteoarthritis, right wrist: Secondary | ICD-10-CM | POA: Diagnosis not present

## 2020-06-03 DIAGNOSIS — J189 Pneumonia, unspecified organism: Secondary | ICD-10-CM | POA: Diagnosis not present

## 2020-06-03 DIAGNOSIS — Z299 Encounter for prophylactic measures, unspecified: Secondary | ICD-10-CM | POA: Diagnosis not present

## 2020-06-03 DIAGNOSIS — I471 Supraventricular tachycardia: Secondary | ICD-10-CM | POA: Diagnosis not present

## 2020-06-03 DIAGNOSIS — I10 Essential (primary) hypertension: Secondary | ICD-10-CM | POA: Diagnosis not present

## 2020-06-06 DIAGNOSIS — H401132 Primary open-angle glaucoma, bilateral, moderate stage: Secondary | ICD-10-CM | POA: Diagnosis not present

## 2020-06-07 DIAGNOSIS — R05 Cough: Secondary | ICD-10-CM | POA: Diagnosis not present

## 2020-06-08 ENCOUNTER — Encounter: Payer: Self-pay | Admitting: *Deleted

## 2020-06-09 ENCOUNTER — Other Ambulatory Visit: Payer: Self-pay

## 2020-06-09 ENCOUNTER — Encounter: Payer: Self-pay | Admitting: Cardiology

## 2020-06-09 ENCOUNTER — Ambulatory Visit (INDEPENDENT_AMBULATORY_CARE_PROVIDER_SITE_OTHER): Payer: Medicare Other | Admitting: Cardiology

## 2020-06-09 VITALS — BP 174/82 | HR 72 | Ht 59.5 in | Wt 115.0 lb

## 2020-06-09 DIAGNOSIS — I471 Supraventricular tachycardia: Secondary | ICD-10-CM | POA: Diagnosis not present

## 2020-06-09 DIAGNOSIS — I10 Essential (primary) hypertension: Secondary | ICD-10-CM | POA: Diagnosis not present

## 2020-06-09 MED ORDER — METOPROLOL SUCCINATE ER 25 MG PO TB24: 50.0000 mg | ORAL_TABLET | Freq: Every morning | ORAL | 2 refills | Status: AC

## 2020-06-09 NOTE — Progress Notes (Signed)
Cardiology Office Note  Date: 06/09/2020   ID: Kathy Ford, DOB October 26, 1928, MRN [id number]  PCP:  Glenda Chroman, MD  Cardiologist:  Rozann Lesches, MD Electrophysiologist:  None   Chief Complaint  Patient presents with  . History of SVT    History of Present Illness: Kathy Ford is an 86 y.o. female referred for cardiology consultation by Dr. Woody Seller with history of PSVT.  I reviewed available records and updated the chart.  She presents reporting good control of palpitations on current regimen.  Toprol-XL has been increased to a total of 75 mg daily by PCP.  She does not describe any exertional chest pain, no syncope.  She recently established with Dr. Woody Seller, was a former long-term patient of Dr. Luan Pulling.  I reviewed her echocardiogram and cardiac monitor results from [facility] internal medicine done in June and noted below.  Longest episode of SVT was 18 seconds.  I personally reviewed her ECG today which shows normal sinus rhythm with rightward axis.  Past Medical History:  Diagnosis Date  . Anemia   . Arthritis   . Essential hypertension   . GERD (gastroesophageal reflux disease)   . Glaucoma   . PSVT (paroxysmal supraventricular tachycardia) ([place])     Past Surgical History:  Procedure Laterality Date  . BREAST BIOPSY     x2  . COLONOSCOPY N/A 12/19/2012   Procedure: COLONOSCOPY;  Surgeon: Rogene Houston, MD;  [facility];  Service: Endoscopy;  Laterality: N/A;  1030-moved to 1225 Ann to notify pt  . ESOPHAGOGASTRODUODENOSCOPY  04/23/2012   Procedure: ESOPHAGOGASTRODUODENOSCOPY (EGD);  Surgeon: Rogene Houston, MD;  Location: [facility];  Service: Endoscopy;  Laterality: N/A;  1200  . INGUINAL HERNIA REPAIR  1998  . INGUINAL HERNIA REPAIR Left 02/19/2014   Procedure: HERNIA REPAIR INGUINAL ADULT;  Surgeon: Jamesetta So, MD;  Location: [facility] ORS;  Service: General;  Laterality: Left;  . INSERTION OF MESH Left 02/19/2014   Procedure: INSERTION OF MESH;   Surgeon: Jamesetta So, MD;  Location: [facility] ORS;  Service: General;  Laterality: Left;  . JOINT REPLACEMENT Bilateral   . LAPAROSCOPIC CHOLECYSTECTOMY    . TOTAL KNEE ARTHROPLASTY Bilateral    x 2 (both) one in 2010 and 2011    Current Outpatient Medications  Medication Sig Dispense Refill  . acetaminophen (TYLENOL) 500 MG tablet Take 1,000 mg by mouth as needed.    Marland Kitchen aspirin 81 MG tablet Take 81 mg by mouth daily.      . brimonidine (ALPHAGAN) 0.15 % ophthalmic solution Place 1 drop into both eyes 3 (three) times daily.     . Calcium Carbonate (CALTRATE 600 PO) Take 1 tablet by mouth daily.    Marland Kitchen esomeprazole (NEXIUM) 40 MG capsule Take 40 mg by mouth daily before breakfast.    . hydrochlorothiazide (HYDRODIURIL) 25 MG tablet Take 25 mg by mouth daily.    Marland Kitchen latanoprost (XALATAN) 0.005 % ophthalmic solution Place 1 drop into both eyes daily.     . metoprolol succinate (TOPROL-XL) 25 MG 24 hr tablet Take 2 tablets (50 mg total) by mouth in the morning. & 25 mg in the evening 270 tablet 2  . Multiple Vitamin (MULTIVITAMIN) tablet Take 1 tablet by mouth daily.      Marland Kitchen olmesartan-hydrochlorothiazide (BENICAR HCT) 40-25 MG tablet Take 1 tablet by mouth daily.    . potassium chloride SA (K-DUR,KLOR-CON) 20 MEQ tablet Take 20 mEq by mouth 3 (three) times  daily.       No current facility-administered medications for this visit.   Allergies:  Bee venom and Caine-1 [lidocaine]   Social History: The patient  reports that she has never smoked. She has never used smokeless tobacco. She reports that she does not drink alcohol and does not use drugs.   Family History: The patient's family history includes CAD in her father; COPD in her mother; Cancer in her brother and son; Colon polyps in her sister.   ROS:   No syncope.  Physical Exam: VS:  BP (!) 174/82   Pulse 72   Ht 4' 11.5" (1.511 m)   Wt 115 lb (52.2 kg)   SpO2 98%   BMI 22.84 kg/m , BMI Body mass index is 22.84 kg/m.  Wt Readings from  Last 3 Encounters:  06/09/20 115 lb (52.2 kg)  04/12/20 118 lb 8 oz (53.8 kg)  12/10/19 117 lb (53.1 kg)    General: Patient appears comfortable at rest. HEENT: Conjunctiva and lids normal, wearing a mask. Neck: Supple, no elevated JVP or carotid bruits, no thyromegaly. Lungs: Clear to auscultation, nonlabored breathing at rest. Cardiac: Regular rate and rhythm, no S3 or significant systolic murmur, no pericardial rub. Abdomen: Soft, nontender, no hepatomegaly, bowel sounds present, no guarding or rebound. Extremities: No pitting edema, distal pulses 2+.  ECG:  An ECG dated 02/17/2014 was personally reviewed today and demonstrated:  Sinus bradycardia.  Recent Labwork: 06/19/2019: ALT 9; AST 16; BUN 20; Creat 0.70; Potassium 3.8; Sodium 143; TSH 2.130  June 2021: BUN 18, creatinine 0.72, potassium 4.0, AST 15, ALT 11  Other Studies Reviewed Today:  Echocardiogram 03/21/2020 [facility] internal medicine): Normal LV wall thickness and chamber size with LVEF 60%, normal RV contraction, moderate left atrial enlargement, sclerotic aortic valve without stenosis, moderate tricuspid regurgitation with estimated RVSP 34 mmHg, no pericardial effusion.  Cardiac monitor June 2021 [facility] internal medicine): ZIO XT, 13 days 15 hours analyzed.  Predominant rhythm is sinus with heart rate ranging 42 bpm up to 124 bpm and average heart rate 65 bpm.  Occasional PACs were noted representing 1.6% total beats, rare PVCs noted representing 1% total beats.  There were multiple episodes of SVT, the longest of which lasted for 18 seconds.  Also burst of NSVT versus aberrantly conducted SVT.  Assessment and Plan:  1.  Frequent but brief episodes of SVT noted by cardiac monitor with history of intermittent palpitations but no syncope.  LVEF is normal at 60% by recent echocardiogram.  She is doing well at this time in terms of symptom control on Toprol-XL which she will continue at 50 mg in the morning and 25 mg in the  evening.  ECG reviewed.  2.  Essential hypertension, blood pressure elevated today.  She is currently on HCTZ along with Benicar and Toprol-XL.  Keep follow-up with PCP for further adjustments.  Norvasc would be a consideration if needed.  Medication Adjustments/Labs and Tests Ordered: Current medicines are reviewed at length with the patient today.  Concerns regarding medicines are outlined above.   Tests Ordered: Orders Placed This Encounter  Procedures  . EKG 12-Lead    Medication Changes: Meds ordered this encounter  Medications  . metoprolol succinate (TOPROL-XL) 25 MG 24 hr tablet    Sig: Take 2 tablets (50 mg total) by mouth in the morning. & 25 mg in the evening    Dispense:  270 tablet    Refill:  2    06/09/2020 dose increase  Disposition:  Follow up 6 months in the nose.  Signed, Satira Sark, MD, [facility] 06/09/2020 3:23 PM    [facility] [place], [place], [place] [address] Phone: [phone]; Fax: [phone]

## 2020-06-09 NOTE — Patient Instructions (Addendum)
Medication Instructions:   Your physician has recommended you make the following change in your medication:   Take metoprolol succinate 50 mg in the morning and 25 mg in the evening  Continue other medications the same  Labwork:  None  Testing/Procedures:  None  Follow-Up:  Your physician recommends that you schedule a follow-up appointment in: 6 months.   Any Other Special Instructions Will Be Listed Below (If Applicable).  If you need a refill on your cardiac medications before your next appointment, please call your pharmacy.

## 2020-06-27 DIAGNOSIS — Z299 Encounter for prophylactic measures, unspecified: Secondary | ICD-10-CM | POA: Diagnosis not present

## 2020-06-27 DIAGNOSIS — I10 Essential (primary) hypertension: Secondary | ICD-10-CM | POA: Diagnosis not present

## 2020-06-27 DIAGNOSIS — I471 Supraventricular tachycardia: Secondary | ICD-10-CM | POA: Diagnosis not present

## 2020-07-01 DIAGNOSIS — I471 Supraventricular tachycardia: Secondary | ICD-10-CM | POA: Diagnosis not present

## 2020-07-01 DIAGNOSIS — I10 Essential (primary) hypertension: Secondary | ICD-10-CM | POA: Diagnosis not present

## 2020-07-01 DIAGNOSIS — Z299 Encounter for prophylactic measures, unspecified: Secondary | ICD-10-CM | POA: Diagnosis not present

## 2020-07-04 DIAGNOSIS — M79672 Pain in left foot: Secondary | ICD-10-CM | POA: Diagnosis not present

## 2020-07-04 DIAGNOSIS — L600 Ingrowing nail: Secondary | ICD-10-CM | POA: Diagnosis not present

## 2020-07-04 DIAGNOSIS — I739 Peripheral vascular disease, unspecified: Secondary | ICD-10-CM | POA: Diagnosis not present

## 2020-07-04 DIAGNOSIS — M79671 Pain in right foot: Secondary | ICD-10-CM | POA: Diagnosis not present

## 2020-08-15 ENCOUNTER — Encounter: Payer: Self-pay | Admitting: Obstetrics & Gynecology

## 2020-08-15 ENCOUNTER — Ambulatory Visit (INDEPENDENT_AMBULATORY_CARE_PROVIDER_SITE_OTHER): Payer: Medicare Other | Admitting: Obstetrics & Gynecology

## 2020-08-15 VITALS — BP 184/85 | HR 84 | Ht 59.0 in | Wt 114.0 lb

## 2020-08-15 DIAGNOSIS — N8111 Cystocele, midline: Secondary | ICD-10-CM

## 2020-08-15 DIAGNOSIS — Z4689 Encounter for fitting and adjustment of other specified devices: Secondary | ICD-10-CM

## 2020-08-15 MED ORDER — METRONIDAZOLE 0.75 % VA GEL: VAGINAL | 11 refills | Status: DC

## 2020-08-15 NOTE — Progress Notes (Signed)
Chief Complaint  Patient presents with  . Pessary Check    Blood pressure (!) 184/85, pulse 84, height 4\' 11"  (1.499 m), weight 114 lb (51.7 kg).  Kathy Ford presents today for routine follow up related to her pessary.   She uses a Milex ring with support #2 She reports little vaginal discharge or vaginal bleeding.  Exam reveals no undue vaginal mucosal pressure of breakdown, little discharge and no vaginal bleeding.  The pessary is removed, cleaned and replaced without difficulty.    Kathy Ford will be sen back in 4 months for continued follow up.  Meds ordered this encounter  Medications  . metroNIDAZOLE (METROGEL VAGINAL) 0.75 % vaginal gel    Sig: Nightly x 5 nights    Dispense:  70 g    Refill:  Jewell, MD  08/15/2020 2:15 PM

## 2020-08-18 DIAGNOSIS — H01002 Unspecified blepharitis right lower eyelid: Secondary | ICD-10-CM | POA: Diagnosis not present

## 2020-08-18 DIAGNOSIS — H401132 Primary open-angle glaucoma, bilateral, moderate stage: Secondary | ICD-10-CM | POA: Diagnosis not present

## 2020-08-18 DIAGNOSIS — H01001 Unspecified blepharitis right upper eyelid: Secondary | ICD-10-CM | POA: Diagnosis not present

## 2020-08-18 DIAGNOSIS — H01004 Unspecified blepharitis left upper eyelid: Secondary | ICD-10-CM | POA: Diagnosis not present

## 2020-08-29 ENCOUNTER — Telehealth: Payer: Self-pay | Admitting: Obstetrics & Gynecology

## 2020-08-29 ENCOUNTER — Telehealth: Payer: Self-pay

## 2020-08-29 MED ORDER — CLINDAMYCIN PHOSPHATE 1 % EX GEL: Freq: Two times a day (BID) | CUTANEOUS | 11 refills | Status: DC

## 2020-08-29 NOTE — Telephone Encounter (Signed)
Pt states she was just here, and Dr order a rx for odor  She needs something different--as she had a reaction to this medication prior

## 2020-08-30 DIAGNOSIS — I471 Supraventricular tachycardia: Secondary | ICD-10-CM | POA: Diagnosis not present

## 2020-08-30 DIAGNOSIS — Z299 Encounter for prophylactic measures, unspecified: Secondary | ICD-10-CM | POA: Diagnosis not present

## 2020-08-30 DIAGNOSIS — K219 Gastro-esophageal reflux disease without esophagitis: Secondary | ICD-10-CM | POA: Diagnosis not present

## 2020-08-30 DIAGNOSIS — Z713 Dietary counseling and surveillance: Secondary | ICD-10-CM | POA: Diagnosis not present

## 2020-08-30 DIAGNOSIS — I10 Essential (primary) hypertension: Secondary | ICD-10-CM | POA: Diagnosis not present

## 2020-08-30 NOTE — Telephone Encounter (Signed)
Pt informed that new rx was sent to pharmacy. No other questions at this time.

## 2020-09-08 ENCOUNTER — Emergency Department (HOSPITAL_COMMUNITY): Payer: Medicare Other

## 2020-09-08 ENCOUNTER — Other Ambulatory Visit: Payer: Self-pay

## 2020-09-08 ENCOUNTER — Emergency Department (HOSPITAL_COMMUNITY)
Admission: EM | Admit: 2020-09-08 | Discharge: 2020-09-09 | Disposition: A | Discharge status: Home / Self Care | Payer: Medicare Other | Attending: Emergency Medicine | Admitting: Emergency Medicine

## 2020-09-08 ENCOUNTER — Encounter (HOSPITAL_COMMUNITY): Payer: Self-pay

## 2020-09-08 DIAGNOSIS — Z7982 Long term (current) use of aspirin: Secondary | ICD-10-CM | POA: Insufficient documentation

## 2020-09-08 DIAGNOSIS — I10 Essential (primary) hypertension: Secondary | ICD-10-CM | POA: Insufficient documentation

## 2020-09-08 DIAGNOSIS — Z20822 Contact with and (suspected) exposure to covid-19: Secondary | ICD-10-CM | POA: Insufficient documentation

## 2020-09-08 DIAGNOSIS — E876 Hypokalemia: Secondary | ICD-10-CM | POA: Insufficient documentation

## 2020-09-08 DIAGNOSIS — Z96653 Presence of artificial knee joint, bilateral: Secondary | ICD-10-CM | POA: Insufficient documentation

## 2020-09-08 DIAGNOSIS — Z79899 Other long term (current) drug therapy: Secondary | ICD-10-CM | POA: Diagnosis not present

## 2020-09-08 DIAGNOSIS — R509 Fever, unspecified: Secondary | ICD-10-CM | POA: Diagnosis not present

## 2020-09-08 DIAGNOSIS — E860 Dehydration: Secondary | ICD-10-CM | POA: Diagnosis not present

## 2020-09-08 DIAGNOSIS — N3001 Acute cystitis with hematuria: Secondary | ICD-10-CM | POA: Diagnosis not present

## 2020-09-08 DIAGNOSIS — J90 Pleural effusion, not elsewhere classified: Secondary | ICD-10-CM | POA: Diagnosis not present

## 2020-09-08 LAB — BASIC METABOLIC PANEL
Anion gap: 11 (ref 5–15)
BUN: 16 mg/dL (ref 8–23)
CO2: 27 mmol/L (ref 22–32)
Calcium: 8.9 mg/dL (ref 8.9–10.3)
Chloride: 92 mmol/L — ABNORMAL LOW (ref 98–111)
Creatinine, Ser: 0.65 mg/dL (ref 0.44–1.00)
GFR, Estimated: >60 mL/min (ref >60)
Glucose, Bld: 143 mg/dL — ABNORMAL HIGH (ref 70–99)
Potassium: 3 mmol/L — ABNORMAL LOW (ref 3.5–5.1)
Sodium: 130 mmol/L — ABNORMAL LOW (ref 135–145)

## 2020-09-08 LAB — CBC WITH DIFFERENTIAL/PLATELET
Abs Immature Granulocytes: 0.05 10*3/uL (ref 0.00–0.07)
Basophils Absolute: 0 10*3/uL (ref 0.0–0.1)
Basophils Relative: 0 %
Eosinophils Absolute: 0 10*3/uL (ref 0.0–0.5)
Eosinophils Relative: 0 %
HCT: 34.4 % — ABNORMAL LOW (ref 36.0–46.0)
Hemoglobin: 10.8 g/dL — ABNORMAL LOW (ref 12.0–15.0)
Immature Granulocytes: 1 %
Lymphocytes Relative: 17 %
Lymphs Abs: 1.8 10*3/uL (ref 0.7–4.0)
MCH: 29.6 pg (ref 26.0–34.0)
MCHC: 31.4 g/dL (ref 30.0–36.0)
MCV: 94.2 fL (ref 80.0–100.0)
Monocytes Absolute: 1.2 10*3/uL — ABNORMAL HIGH (ref 0.1–1.0)
Monocytes Relative: 11 %
Neutro Abs: 7.8 10*3/uL — ABNORMAL HIGH (ref 1.7–7.7)
Neutrophils Relative %: 71 %
Platelets: 687 10*3/uL — ABNORMAL HIGH (ref 150–400)
RBC: 3.65 MIL/uL — ABNORMAL LOW (ref 3.87–5.11)
RDW: 13.3 % (ref 11.5–15.5)
WBC: 10.9 10*3/uL — ABNORMAL HIGH (ref 4.0–10.5)
nRBC: 0 % (ref 0.0–0.2)

## 2020-09-08 LAB — URINALYSIS, ROUTINE W REFLEX MICROSCOPIC
Bilirubin Urine: NEGATIVE
Glucose, UA: NEGATIVE mg/dL
Ketones, ur: NEGATIVE mg/dL
Nitrite: NEGATIVE
Protein, ur: 30 mg/dL — AB
Specific Gravity, Urine: 1.02 (ref 1.005–1.030)
pH: 5 (ref 5.0–8.0)

## 2020-09-08 LAB — RESP PANEL BY RT-PCR (FLU A&B, COVID) ARPGX2
Influenza A by PCR: NEGATIVE
Influenza B by PCR: NEGATIVE
SARS Coronavirus 2 by RT PCR: NEGATIVE

## 2020-09-08 MED ORDER — SODIUM CHLORIDE 0.9 % IV BOLUS (SEPSIS)
1000.0000 mL | Freq: Once | INTRAVENOUS | Status: AC
  Administered 2020-09-08: 1000 mL via INTRAVENOUS

## 2020-09-08 MED ORDER — ACETAMINOPHEN 325 MG PO TABS
650.0000 mg | ORAL_TABLET | Freq: Once | ORAL | Status: AC
  Administered 2020-09-08: 650 mg via ORAL
  Filled 2020-09-08: qty 2

## 2020-09-08 MED ORDER — SODIUM CHLORIDE 0.9 % IV SOLN
1.0000 g | Freq: Once | INTRAVENOUS | Status: AC
  Administered 2020-09-08: 1 g via INTRAVENOUS
  Filled 2020-09-08: qty 10

## 2020-09-08 NOTE — ED Triage Notes (Signed)
Pt brought in by grandson and POA. Pt c/o fever, chills, loss of appetite for 2 weeks. Pt has seen PCP and everything checked out okay. Pt also reports generalized weakness and decrease in mobility.

## 2020-09-09 DIAGNOSIS — N3001 Acute cystitis with hematuria: Secondary | ICD-10-CM | POA: Diagnosis not present

## 2020-09-09 MED ORDER — POTASSIUM CHLORIDE CRYS ER 20 MEQ PO TBCR
40.0000 meq | EXTENDED_RELEASE_TABLET | Freq: Once | ORAL | Status: AC
  Administered 2020-09-09: 40 meq via ORAL
  Filled 2020-09-09: qty 2

## 2020-09-09 MED ORDER — CEPHALEXIN 500 MG PO CAPS: 500.0000 mg | ORAL_CAPSULE | Freq: Two times a day (BID) | ORAL | 0 refills | Status: DC

## 2020-09-09 NOTE — ED Notes (Signed)
Patient discharged to home.  All discharge instructions reviewed.  Patient demonstrated understanding via teachback method.  Wheelchair out of ED.

## 2020-09-09 NOTE — ED Provider Notes (Signed)
[facility] EMERGENCY DEPARTMENT Provider Note   CSN: [id number] Arrival date & time: 09/08/20  1837     History Chief Complaint  Patient presents with  . Fever    Kathy Ford is a 86 y.o. female.  The history is provided by the patient and a friend.  Fever Severity:  Moderate Onset quality:  Gradual Timing:  Intermittent Progression:  Worsening Chronicity:  New Relieved by:  Nothing Worsened by:  Nothing Associated symptoms: chills and cough   Associated symptoms: no diarrhea, no dysuria, no headaches and no vomiting   Patient with history of hypertension presents with fatigue and fevers Patient reports ever since having a pessary placed last month, she has not felt well.  Over the past week she has had intermittent fevers.  She had decreased appetite and has been sleeping more frequently.  She is typically very active.  No vomiting or diarrhea.  She has had minimal cough.  No dysuria     Past Medical History:  Diagnosis Date  . Anemia   . Arthritis   . Essential hypertension   . GERD (gastroesophageal reflux disease)   . Glaucoma   . PSVT (paroxysmal supraventricular tachycardia) [facility])     Patient Active Problem List   Diagnosis Date Noted  . Closed fracture of left proximal humerus 03/15/19 03/23/2019  . GERD (gastroesophageal reflux disease) 03/25/2012  . HYPERLIPIDEMIA-MIXED 03/25/2009  . HYPERTENSION, UNSPECIFIED 03/25/2009    Past Surgical History:  Procedure Laterality Date  . BREAST BIOPSY     x2  . COLONOSCOPY N/A 12/19/2012   Procedure: COLONOSCOPY;  Surgeon: Rogene Houston, MD;  [facility];  Service: Endoscopy;  Laterality: N/A;  1030-moved to 1225 Ann to notify pt  . ESOPHAGOGASTRODUODENOSCOPY  04/23/2012   Procedure: ESOPHAGOGASTRODUODENOSCOPY (EGD);  Surgeon: Rogene Houston, MD;  [facility];  Service: Endoscopy;  Laterality: N/A;  1200  . INGUINAL HERNIA REPAIR  1998  . INGUINAL HERNIA REPAIR Left 02/19/2014    Procedure: HERNIA REPAIR INGUINAL ADULT;  Surgeon: Jamesetta So, MD;  Location: [facility] ORS;  Service: General;  Laterality: Left;  . INSERTION OF MESH Left 02/19/2014   Procedure: INSERTION OF MESH;  Surgeon: Jamesetta So, MD;  Location: [facility] ORS;  Service: General;  Laterality: Left;  . JOINT REPLACEMENT Bilateral   . LAPAROSCOPIC CHOLECYSTECTOMY    . TOTAL KNEE ARTHROPLASTY Bilateral    x 2 (both) one in 2010 and 2011     OB History    Gravida  3   Para  3   Term  3   Preterm      AB      Living  2     SAB      TAB      Ectopic      Multiple      Live Births  3           Family History  Problem Relation Age of Onset  . Colon polyps Sister   . COPD Mother   . CAD Father   . Cancer Brother   . Cancer Son   . Colon cancer Neg Hx     Social History   Tobacco Use  . Smoking status: Never Smoker  . Smokeless tobacco: Never Used  Vaping Use  . Vaping Use: Never used  Substance Use Topics  . Alcohol use: No  . Drug use: No    Home Medications Prior to Admission medications  Medication Sig Start Date End Date Taking? Authorizing Provider  acetaminophen (TYLENOL) 500 MG tablet Take 1,000 mg by mouth as needed.    [provider]  aspirin 81 MG tablet Take 81 mg by mouth daily.      [provider]  brimonidine (ALPHAGAN) 0.15 % ophthalmic solution Place 1 drop into both eyes 3 (three) times daily.     [provider]  Calcium Carbonate (CALTRATE 600 PO) Take 1 tablet by mouth daily.    [provider]  cephALEXin (KEFLEX) 500 MG capsule Take 1 capsule (500 mg total) by mouth 2 (two) times daily. 09/09/20   Ripley Fraise, MD  clindamycin (CLINDAGEL) 1 % gel Apply topically 2 (two) times daily. 08/29/20   Florian Buff, MD  esomeprazole (NEXIUM) 40 MG capsule Take 40 mg by mouth daily before breakfast.    [provider]  hydrochlorothiazide (HYDRODIURIL) 25 MG tablet Take 25 mg by mouth daily.    [provider]  latanoprost (XALATAN) 0.005 % ophthalmic solution Place 1 drop into both eyes daily.     [provider]  metoprolol succinate (TOPROL-XL) 25 MG 24 hr tablet Take 2 tablets (50 mg total) by mouth in the morning. & 25 mg in the evening 06/09/20   Satira Sark, MD  metroNIDAZOLE (METROGEL VAGINAL) 0.75 % vaginal gel Nightly x 5 nights 08/15/20   Florian Buff, MD  Multiple Vitamin (MULTIVITAMIN) tablet Take 1 tablet by mouth daily.      [provider]  olmesartan-hydrochlorothiazide (BENICAR HCT) 40-25 MG tablet Take 1 tablet by mouth daily. 04/07/20   [provider]  potassium chloride SA (K-DUR,KLOR-CON) 20 MEQ tablet Take 20 mEq by mouth 3 (three) times daily.      [provider]    Allergies    Bee venom and Caine-1 [lidocaine]  Review of Systems   Review of Systems  Constitutional: Positive for appetite change, chills, fatigue and fever.  Respiratory: Positive for cough.   Gastrointestinal: Negative for diarrhea and vomiting.  Genitourinary: Negative for dysuria.  Neurological: Negative for headaches.  All other systems reviewed and are negative.   Physical Exam Updated Vital Signs BP (!) 142/69   Pulse (!) 59   Temp 99.8 F (37.7 C) (Rectal)   Resp 19   Ht 1.499 m (4\' 11" )   Wt 50.8 kg   SpO2 95%   BMI 22.62 kg/m   Physical Exam CONSTITUTIONAL: Elderly but appears younger than stated age HEAD: Normocephalic/atraumatic EYES: EOMI/PERRL ENMT: Mucous membranes moist NECK: supple no meningeal signs SPINE/BACK: Kyphotic spine CV: S1/S2 noted, no murmurs/rubs/gallops noted LUNGS: Lungs are clear to auscultation bilaterally, no apparent distress ABDOMEN: soft, nontender, no rebound or guarding, bowel sounds noted throughout abdomen GU:no cva tenderness NEURO: Pt is awake/alert/appropriate, moves all extremitiesx4.  No facial droop.   EXTREMITIES: pulses normal/equal, full ROM SKIN: warm, color normal PSYCH: no  abnormalities of mood noted, alert and oriented to situation  ED Results / Procedures / Treatments   Labs (all labs ordered are listed, but only abnormal results are displayed) Labs Reviewed  BASIC METABOLIC PANEL - Abnormal; Notable for the following components:      Result Value   Sodium 130 (*)    Potassium 3.0 (*)    Chloride 92 (*)    Glucose, Bld 143 (*)    All other components within normal limits  CBC WITH DIFFERENTIAL/PLATELET - Abnormal; Notable for the following components:   WBC 10.9 (*)  RBC 3.65 (*)    Hemoglobin 10.8 (*)    HCT 34.4 (*)    Platelets 687 (*)    Neutro Abs 7.8 (*)    Monocytes Absolute 1.2 (*)    All other components within normal limits  URINALYSIS, ROUTINE W REFLEX MICROSCOPIC - Abnormal; Notable for the following components:   Color, Urine AMBER (*)    APPearance HAZY (*)    Hgb urine dipstick SMALL (*)    Protein, ur 30 (*)    Leukocytes,Ua MODERATE (*)    Bacteria, UA RARE (*)    All other components within normal limits  RESP PANEL BY RT-PCR (FLU A&B, COVID) ARPGX2  URINE CULTURE    EKG EKG Interpretation  Date/Time:  Thursday September 08 2020 21:37:51 EST Ventricular Rate:  84 PR Interval:    QRS Duration: 96 QT Interval:  430 QTC Calculation: 500 R Axis:   100 Text Interpretation: Sinus rhythm Atrial premature complexes in couplets Right axis deviation Borderline repolarization abnormality Borderline prolonged QT interval Interpretation limited secondary to artifact Confirmed by Ripley Fraise ([id number]) on 09/08/2020 11:11:47 PM   Radiology DG Chest Port 1 View  Result Date: 09/08/2020 CLINICAL DATA:  Fevers EXAM: PORTABLE CHEST 1 VIEW COMPARISON:  06/07/2020 FINDINGS: Cardiac shadow is enlarged in size. Aortic calcifications are again noted. The lungs are well aerated bilaterally. No focal infiltrate or sizable effusion is seen. No bony abnormality is noted IMPRESSION: No active disease. Electronically Signed   By: Inez Catalina  M.D.   On: 09/08/2020 23:36    Procedures Procedures   Medications Ordered in ED Medications  sodium chloride 0.9 % bolus 1,000 mL (0 mLs Intravenous Stopped 09/09/20 0105)  cefTRIAXone (ROCEPHIN) 1 g in sodium chloride 0.9 % 100 mL IVPB (0 g Intravenous Stopped 09/09/20 0105)  acetaminophen (TYLENOL) tablet 650 mg (650 mg Oral Given 09/08/20 2339)  potassium chloride SA (KLOR-CON) CR tablet 40 mEq (40 mEq Oral Given 09/09/20 0026)    ED Course  I have reviewed the triage vital signs and the nursing notes.  Pertinent labs & imaging results that were available during my care of the patient were reviewed by me and considered in my medical decision making (see chart for details).    MDM Rules/Calculators/A&P                          Patient presents with fatigue and fevers.  Labs are consistent with UTI.  No signs of pneumonia.  Other than mild dehydration hypokalemia, labs are reassuring. Patient is in no acute distress.  She is not septic appearing.  Blood pressure has remained stable Patient stays with her grandson. She would prefer to be discharged home.  At this time I feel this is a reasonable plan. She was given Rocephin in the ED, and will cover with Keflex at home. We discussed strict ER return precautions. Final Clinical Impression(s) / ED Diagnoses Final diagnoses:  Acute cystitis with hematuria  Dehydration  Hypokalemia    Rx / DC Orders ED Discharge Orders         Ordered    cephALEXin (KEFLEX) 500 MG capsule  2 times daily        09/09/20 0111           Ripley Fraise, MD 09/09/20 0119

## 2020-09-10 LAB — URINE CULTURE

## 2020-09-11 ENCOUNTER — Other Ambulatory Visit: Payer: Self-pay

## 2020-09-11 ENCOUNTER — Emergency Department (HOSPITAL_COMMUNITY): Payer: Medicare Other

## 2020-09-11 ENCOUNTER — Encounter (HOSPITAL_COMMUNITY): Payer: Self-pay | Admitting: Emergency Medicine

## 2020-09-11 ENCOUNTER — Emergency Department (HOSPITAL_COMMUNITY)
Admission: EM | Admit: 2020-09-11 | Discharge: 2020-09-11 | Disposition: A | Discharge status: Home / Self Care | Payer: Medicare Other | Attending: Emergency Medicine | Admitting: Emergency Medicine

## 2020-09-11 DIAGNOSIS — R509 Fever, unspecified: Secondary | ICD-10-CM | POA: Diagnosis not present

## 2020-09-11 DIAGNOSIS — B349 Viral infection, unspecified: Secondary | ICD-10-CM | POA: Insufficient documentation

## 2020-09-11 DIAGNOSIS — R5383 Other fatigue: Secondary | ICD-10-CM | POA: Insufficient documentation

## 2020-09-11 DIAGNOSIS — I10 Essential (primary) hypertension: Secondary | ICD-10-CM | POA: Diagnosis not present

## 2020-09-11 DIAGNOSIS — Z7982 Long term (current) use of aspirin: Secondary | ICD-10-CM | POA: Diagnosis not present

## 2020-09-11 DIAGNOSIS — Z20822 Contact with and (suspected) exposure to covid-19: Secondary | ICD-10-CM | POA: Insufficient documentation

## 2020-09-11 DIAGNOSIS — R059 Cough, unspecified: Secondary | ICD-10-CM | POA: Diagnosis not present

## 2020-09-11 DIAGNOSIS — N390 Urinary tract infection, site not specified: Secondary | ICD-10-CM | POA: Insufficient documentation

## 2020-09-11 DIAGNOSIS — Z96653 Presence of artificial knee joint, bilateral: Secondary | ICD-10-CM | POA: Insufficient documentation

## 2020-09-11 DIAGNOSIS — J90 Pleural effusion, not elsewhere classified: Secondary | ICD-10-CM | POA: Diagnosis not present

## 2020-09-11 DIAGNOSIS — Z79899 Other long term (current) drug therapy: Secondary | ICD-10-CM | POA: Diagnosis not present

## 2020-09-11 DIAGNOSIS — R531 Weakness: Secondary | ICD-10-CM | POA: Insufficient documentation

## 2020-09-11 LAB — BASIC METABOLIC PANEL
Anion gap: 10 (ref 5–15)
BUN: 13 mg/dL (ref 8–23)
CO2: 29 mmol/L (ref 22–32)
Calcium: 9 mg/dL (ref 8.9–10.3)
Chloride: 94 mmol/L — ABNORMAL LOW (ref 98–111)
Creatinine, Ser: 0.46 mg/dL (ref 0.44–1.00)
GFR, Estimated: >60 mL/min (ref >60)
Glucose, Bld: 102 mg/dL — ABNORMAL HIGH (ref 70–99)
Potassium: 3.7 mmol/L (ref 3.5–5.1)
Sodium: 133 mmol/L — ABNORMAL LOW (ref 135–145)

## 2020-09-11 LAB — CBC WITH DIFFERENTIAL/PLATELET
Abs Immature Granulocytes: 0.06 10*3/uL (ref 0.00–0.07)
Basophils Absolute: 0.1 10*3/uL (ref 0.0–0.1)
Basophils Relative: 0 %
Eosinophils Absolute: 0.1 10*3/uL (ref 0.0–0.5)
Eosinophils Relative: 1 %
HCT: 35.7 % — ABNORMAL LOW (ref 36.0–46.0)
Hemoglobin: 11.3 g/dL — ABNORMAL LOW (ref 12.0–15.0)
Immature Granulocytes: 1 %
Lymphocytes Relative: 21 %
Lymphs Abs: 2.6 10*3/uL (ref 0.7–4.0)
MCH: 30 pg (ref 26.0–34.0)
MCHC: 31.7 g/dL (ref 30.0–36.0)
MCV: 94.7 fL (ref 80.0–100.0)
Monocytes Absolute: 1.3 10*3/uL — ABNORMAL HIGH (ref 0.1–1.0)
Monocytes Relative: 10 %
Neutro Abs: 8.4 10*3/uL — ABNORMAL HIGH (ref 1.7–7.7)
Neutrophils Relative %: 67 %
Platelets: 705 10*3/uL — ABNORMAL HIGH (ref 150–400)
RBC: 3.77 MIL/uL — ABNORMAL LOW (ref 3.87–5.11)
RDW: 13.6 % (ref 11.5–15.5)
WBC: 12.4 10*3/uL — ABNORMAL HIGH (ref 4.0–10.5)
nRBC: 0 % (ref 0.0–0.2)

## 2020-09-11 LAB — URINALYSIS, ROUTINE W REFLEX MICROSCOPIC
Bilirubin Urine: NEGATIVE
Glucose, UA: NEGATIVE mg/dL
Ketones, ur: NEGATIVE mg/dL
Nitrite: NEGATIVE
Protein, ur: NEGATIVE mg/dL
Specific Gravity, Urine: 1.012 (ref 1.005–1.030)
pH: 5 (ref 5.0–8.0)

## 2020-09-11 LAB — RESP PANEL BY RT-PCR (FLU A&B, COVID) ARPGX2
Influenza A by PCR: NEGATIVE
Influenza B by PCR: NEGATIVE
SARS Coronavirus 2 by RT PCR: NEGATIVE

## 2020-09-11 MED ORDER — SODIUM CHLORIDE 0.9 % IV SOLN
1.0000 g | Freq: Once | INTRAVENOUS | Status: AC
  Administered 2020-09-11: 1 g via INTRAVENOUS
  Filled 2020-09-11: qty 10

## 2020-09-11 MED ORDER — SODIUM CHLORIDE 0.9 % IV BOLUS
1000.0000 mL | Freq: Once | INTRAVENOUS | Status: AC
  Administered 2020-09-11: 1000 mL via INTRAVENOUS

## 2020-09-11 NOTE — Discharge Instructions (Signed)
Decrease your HCTZ blood pressure medication by half over the next few days.  Check your blood pressure frequently at home.  If it starts become elevated go back to her normal blood pressure medication.  Take Mucinex over-the-counter.  Continue taking the Keflex.  You will be called if your urine culture or blood culture show any bacteria growing.  If you have any new or worsening symptoms please seek reevaluation in the emergency department

## 2020-09-11 NOTE — ED Provider Notes (Signed)
[facility] EMERGENCY DEPARTMENT Provider Note   CSN: [id number] Arrival date & time: 09/11/20  1037    History Chief Complaint  Patient presents with  . Urinary Tract Infection    Kathy Ford is a 86 y.o. female with past medical history significant for GERD, hypertension, SVT who presents for evaluation of fever and lethargy.  Presents with grandson who helps with history.  Was seen in ED 3 days ago for similar complaints.  Diagnosed with a UTI.  Was given Rocephin in ED as well as Keflex.  Patient's grandson states patient has essentially slept the last 3 days. She is generally weak.  She is not eating meals however she is taking small sips of water.  She has been able to keep down her medicines.  Kathy Ford states she has been running temperatures up to 100.0 at home.  He has been alternating Tylenol and ibuprofen with her.  Does have a chronic cough.  She is vaccinated against COVID.  Denies additional aggravating or alleviating factors. 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  Normally very active at home, takes care of self, performs own ADLs. Lives with grandson  History obtained from patient and past medical records.  No interpreter is used.  HPI     Past Medical History:  Diagnosis Date  . Anemia   . Arthritis   . Essential hypertension   . GERD (gastroesophageal reflux disease)   . Glaucoma   . PSVT (paroxysmal supraventricular tachycardia) [facility])     Patient Active Problem List   Diagnosis Date Noted  . Closed fracture of left proximal humerus 03/15/19 03/23/2019  . GERD (gastroesophageal reflux disease) 03/25/2012  . HYPERLIPIDEMIA-MIXED 03/25/2009  . HYPERTENSION, UNSPECIFIED 03/25/2009    Past Surgical History:  Procedure Laterality Date  . BREAST BIOPSY     x2  . COLONOSCOPY N/A 12/19/2012   Procedure: COLONOSCOPY;  Surgeon: Rogene Houston, MD;  Location: [facility] ENDO SUITE;  Service: Endoscopy;  Laterality: N/A;  1030-moved to 1225 Ann to  notify pt  . ESOPHAGOGASTRODUODENOSCOPY  04/23/2012   Procedure: ESOPHAGOGASTRODUODENOSCOPY (EGD);  Surgeon: Rogene Houston, MD;  Location: [facility] ENDO SUITE;  Service: Endoscopy;  Laterality: N/A;  1200  . INGUINAL HERNIA REPAIR  1998  . INGUINAL HERNIA REPAIR Left 02/19/2014   Procedure: HERNIA REPAIR INGUINAL ADULT;  Surgeon: Jamesetta So, MD;  Location: [facility] ORS;  Service: General;  Laterality: Left;  . INSERTION OF MESH Left 02/19/2014   Procedure: INSERTION OF MESH;  Surgeon: Jamesetta So, MD;  Location: [facility] ORS;  Service: General;  Laterality: Left;  . JOINT REPLACEMENT Bilateral   . LAPAROSCOPIC CHOLECYSTECTOMY    . TOTAL KNEE ARTHROPLASTY Bilateral    x 2 (both) one in 2010 and 2011     OB History    Gravida  3   Para  3   Term  3   Preterm      AB      Living  2     SAB      TAB      Ectopic      Multiple      Live Births  3           Family History  Problem Relation Age of Onset  . Colon polyps Sister   . COPD Mother   . CAD Father   . Cancer Brother   . Cancer Son   . Colon cancer Neg Hx     Social History  Tobacco Use  . Smoking status: Never Smoker  . Smokeless tobacco: Never Used  Vaping Use  . Vaping Use: Never used  Substance Use Topics  . Alcohol use: No  . Drug use: No    Home Medications Prior to Admission medications   Medication Sig Start Date End Date Taking? Authorizing Provider  acetaminophen (TYLENOL) 500 MG tablet Take 1,000 mg by mouth as needed.    [provider]  aspirin 81 MG tablet Take 81 mg by mouth daily.      [provider]  brimonidine (ALPHAGAN) 0.15 % ophthalmic solution Place 1 drop into both eyes 3 (three) times daily.     [provider]  Calcium Carbonate (CALTRATE 600 PO) Take 1 tablet by mouth daily.    [provider]  cephALEXin (KEFLEX) 500 MG capsule Take 1 capsule (500 mg total) by mouth 2 (two) times daily. 09/09/20   Kathy Fraise, MD  clindamycin  (CLINDAGEL) 1 % gel Apply topically 2 (two) times daily. 08/29/20   Kathy Buff, MD  esomeprazole (NEXIUM) 40 MG capsule Take 40 mg by mouth daily before breakfast.    [provider]  hydrochlorothiazide (HYDRODIURIL) 25 MG tablet Take 25 mg by mouth daily.    [provider]  latanoprost (XALATAN) 0.005 % ophthalmic solution Place 1 drop into both eyes daily.     [provider]  metoprolol succinate (TOPROL-XL) 25 MG 24 hr tablet Take 2 tablets (50 mg total) by mouth in the morning. & 25 mg in the evening 06/09/20   Kathy Sark, MD  metroNIDAZOLE (METROGEL VAGINAL) 0.75 % vaginal gel Nightly x 5 nights 08/15/20   Kathy Buff, MD  Multiple Vitamin (MULTIVITAMIN) tablet Take 1 tablet by mouth daily.      [provider]  olmesartan-hydrochlorothiazide (BENICAR HCT) 40-25 MG tablet Take 1 tablet by mouth daily. 04/07/20   [provider]  potassium chloride SA (K-DUR,KLOR-CON) 20 MEQ tablet Take 20 mEq by mouth 3 (three) times daily.      [provider]    Allergies    Bee venom and Caine-1 [lidocaine]  Review of Systems   Review of Systems  Constitutional: Positive for activity change, appetite change, chills, fatigue and fever.  HENT: Negative.   Respiratory: Positive for cough (Chronic). Negative for apnea, choking, chest tightness, shortness of breath, wheezing and stridor.   Cardiovascular: Negative.   Gastrointestinal: Negative.   Genitourinary: Negative.   Musculoskeletal: Negative.   Skin: Negative.   Neurological: Positive for weakness (Generalized ). Negative for dizziness, tremors, seizures, syncope, facial asymmetry, speech difficulty, light-headedness, numbness and headaches.  All other systems reviewed and are negative.   Physical Exam Updated Vital Signs BP (!) 156/74   Pulse 77   Temp 98.7 F (37.1 C) (Oral)   Resp (!) 23   Ht 4\' 11"  (1.499 m)   Wt 55.3 kg   SpO2 96%   BMI 24.64 kg/m   Physical  Exam Vitals and nursing note reviewed.  Constitutional:      General: She is not in acute distress.    Appearance: She is well-developed. She is not ill-appearing, toxic-appearing or diaphoretic.  HENT:     Head: Atraumatic.     Nose: Nose normal.     Mouth/Throat:     Mouth: Mucous membranes are dry.  Eyes:     Pupils: Pupils are equal, round, and reactive to light.  Cardiovascular:     Rate and Rhythm: Normal  rate.     Pulses: Normal pulses.     Heart sounds: Normal heart sounds.  Pulmonary:     Effort: Pulmonary effort is normal. No respiratory distress.     Breath sounds: Normal breath sounds.  Abdominal:     General: Bowel sounds are normal. There is no distension.     Tenderness: There is no abdominal tenderness. There is no right CVA tenderness, left CVA tenderness, guarding or rebound.  Musculoskeletal:        General: No swelling, tenderness, deformity or signs of injury. Normal range of motion.     Cervical back: Normal range of motion.     Right lower leg: No edema.     Left lower leg: No edema.  Skin:    General: Skin is warm and dry.     Capillary Refill: Capillary refill takes less than 2 seconds.  Neurological:     General: No focal deficit present.     Mental Status: She is alert and oriented to person, place, and time.     ED Results / Procedures / Treatments   Labs (all labs ordered are listed, but only abnormal results are displayed) Labs Reviewed  CBC WITH DIFFERENTIAL/PLATELET - Abnormal; Notable for the following components:      Result Value   WBC 12.4 (*)    RBC 3.77 (*)    Hemoglobin 11.3 (*)    HCT 35.7 (*)    Platelets 705 (*)    Neutro Abs 8.4 (*)    Monocytes Absolute 1.3 (*)    All other components within normal limits  BASIC METABOLIC PANEL - Abnormal; Notable for the following components:   Sodium 133 (*)    Chloride 94 (*)    Glucose, Bld 102 (*)    All other components within normal limits  URINALYSIS, ROUTINE W REFLEX  MICROSCOPIC - Abnormal; Notable for the following components:   APPearance HAZY (*)    Hgb urine dipstick SMALL (*)    Leukocytes,Ua SMALL (*)    Bacteria, UA RARE (*)    All other components within normal limits  RESP PANEL BY RT-PCR (FLU A&B, COVID) ARPGX2  URINE CULTURE  CULTURE, BLOOD (ROUTINE X 2)  CULTURE, BLOOD (ROUTINE X 2)    EKG None  Radiology DG Chest Portable 1 View  Result Date: 09/11/2020 CLINICAL DATA:  Fever and cough EXAM: PORTABLE CHEST 1 VIEW COMPARISON:  09/08/2020 FINDINGS: Cardiac shadow is mildly prominent. Aortic calcifications are again seen. The lungs are well aerated bilaterally. No focal infiltrate is seen. Minimal blunting of the left costophrenic angle is noted. IMPRESSION: Small left pleural effusion new from the prior exam. Electronically Signed   By: Inez Catalina M.D.   On: 09/11/2020 12:56    Procedures Procedures (including critical care time)  Medications Ordered in ED Medications  sodium chloride 0.9 % bolus 1,000 mL (0 mLs Intravenous Stopped 09/11/20 1551)  cefTRIAXone (ROCEPHIN) 1 g in sodium chloride 0.9 % 100 mL IVPB (0 g Intravenous Stopped 09/11/20 1647)   ED Course  I have reviewed the triage vital signs and the nursing notes.  Pertinent labs & imaging results that were available during my care of the patient were reviewed by me and considered in my medical decision making (see chart for details).  86 year old presents for evaluation of fever and weakness.  On arrival she is afebrile, nonseptic, non-ill-appearing.  Seen in ED 3 days ago.  Diagnosed with UTI.  Rocephin in ED and Keflex at home.  Patient has had worsening decline, lethargy and generalized weakness.  She is not eating and drinking at home due to lack of appetite.  Per patient and family she has chronic suprapubic pain after having a pessary placed however denies any dysuria or hematuria.  Family is concerned about patient's malaise, sleeping essentially all day and all night.   On arrival patient has nonfocal neuro exam without deficits.  Her heart and lungs are clear.  Abdomen is soft, nontender.  She has normal neuro exam without deficits. We will plan on labs, imaging and reassess.  Labs and imaging personally reviewed and interpreted:  CBC with leukocytosis with left shift at 12.4, up from prior Metabolic panel with hyponatremia 133, glucose 102 however additionl electrolyte, renal or liver abnormality UA with leuks, bacteria, WBC 11-20 with mucus present will send for culture DG chest without pneumonia does have small left pleural effusion EKG without ischemia  Patient reassessed.  Feels generally weak.  Urinalysis does still show UTI.  Given progressive weakness, low-grade fevers at home, decreased p.o. intake will admit for failed outpatient treatment antibiotics for UTI.  Urine culture sent.   CONSULT with Dr. Denton Brick with [facility] who agrees to evaluate patient. He is unsure if patient meets inpatient criteria at this time.  Discussed with attending, Dr. Roderic Palau who agrees with above treatment, plan and disposition.   Dr. Denton Brick with [facility] has evaluated patient.  See his note.  He does not feel patient meets inpatient criteria.  Feel she likely has more viral illness than UTI.  Does request to obtain blood cultures.  Will DC patient home with strict return precautions.  We will plan on following up on her urine and blood cultures.  Patient is tolerating p.o. intake here and is ambulating without difficulty.  She does not appear septic at this time. Encouraged continuing Keflex at home until UA culture results.  The patient has been appropriately medically screened and/or stabilized in the ED. I have low suspicion for any other emergent medical condition which would require further screening, evaluation or treatment in the ED or require inpatient management.  Patient is hemodynamically stable and in no acute distress.  Patient able to ambulate in department  prior to ED.  Evaluation does not show acute pathology that would require ongoing or additional emergent interventions while in the emergency department or further inpatient treatment.  I have discussed the diagnosis with the patient and answered all questions.  Pain is been managed while in the emergency department and patient has no further complaints prior to discharge.  Patient is comfortable with plan discussed in room and is stable for discharge at this time.  I have discussed strict return precautions for returning to the emergency department.  Patient was encouraged to follow-up with PCP/specialist refer to at discharge.    MDM Rules/Calculators/A&P                           Final Clinical Impression(s) / ED Diagnoses Final diagnoses:  Weakness  Viral illness    Rx / DC Orders ED Discharge Orders    None       Taeshawn Helfman A, PA-C 09/11/20 1752    Milton Ferguson, MD 09/12/20 [phone]

## 2020-09-11 NOTE — Consult Note (Addendum)
   Patient seen and evaluated, chart reviewed,   H/o obtained from pt and Grandson , as well as EDP  --- Seen 09/08/20 with fatigue, malaise, low-grade temp (T max 100.1) Cough with clear sputum and rhinorrhea with clear drainage -Patient had been around little kids for the last week or so prior to getting sick -Denies dysuria or flank pain or hematuria -Patient had some urinary frequency due to HCTZ use and attempts to drink more due to upper respiratory viral infection type symptoms -No chest pains no palpitations no shortness of breath -I saw  patient transfer from chair to bed with ease, no dyspnea on exertion, no dizziness, no pleuritic symptoms--patient ambulating around the room while holding onto furniture in the exam room with one hand -Urine culture from 09/08/2020 without growth --Chest x-ray and labs reviewed -Sodium and potassium improved from 3 days ago - --Blood and urine cultures obtained and pending today -EDP gave IV fluids and IV Rocephin -Patient already has Keflex from ED visit 3 days ago -We will call her back if the blood or urine cultures grows anything  -Suspect patient has viral upper respiratory symptoms/viral syndrome with fatigue, malaise, low-grade temperatures poor appetite, rhinorrhea, postnasal drainage and dry cough -  Recommendations--:- --Due to concerns about decreased appetite patient will decrease HCTZ to 12.5 mg daily from 25 mg daily until oral intake improves to avoid dehydration --Especially given recent episodes of hyponatremia and hypokalemia -Otherwise supportive and symptomatic treatment advised, mucolytics and Tylenol as needed, maintain adequate hydration, drink flluids  -Case discussed with EDP -Disposition per the EDP -Total care time 43 minutes  Roxan Hockey, MD

## 2020-09-11 NOTE — ED Triage Notes (Signed)
Pt to the ED by the Grandson who is also the POA. Pt was seen here 09/08/20 and Dx with a UTI. Pt is still weak and not eating or drink normally.

## 2020-09-13 LAB — URINE CULTURE: Culture: 30000 — AB

## 2020-09-14 DIAGNOSIS — R5383 Other fatigue: Secondary | ICD-10-CM | POA: Diagnosis not present

## 2020-09-14 DIAGNOSIS — R630 Anorexia: Secondary | ICD-10-CM | POA: Diagnosis not present

## 2020-09-14 DIAGNOSIS — E871 Hypo-osmolality and hyponatremia: Secondary | ICD-10-CM | POA: Diagnosis not present

## 2020-09-14 DIAGNOSIS — N390 Urinary tract infection, site not specified: Secondary | ICD-10-CM | POA: Diagnosis not present

## 2020-09-14 DIAGNOSIS — Z299 Encounter for prophylactic measures, unspecified: Secondary | ICD-10-CM | POA: Diagnosis not present

## 2020-09-16 DIAGNOSIS — I471 Supraventricular tachycardia: Secondary | ICD-10-CM | POA: Diagnosis not present

## 2020-09-16 DIAGNOSIS — R630 Anorexia: Secondary | ICD-10-CM | POA: Diagnosis not present

## 2020-09-16 DIAGNOSIS — I10 Essential (primary) hypertension: Secondary | ICD-10-CM | POA: Diagnosis not present

## 2020-09-16 DIAGNOSIS — R634 Abnormal weight loss: Secondary | ICD-10-CM | POA: Diagnosis not present

## 2020-09-16 DIAGNOSIS — Z299 Encounter for prophylactic measures, unspecified: Secondary | ICD-10-CM | POA: Diagnosis not present

## 2020-09-16 DIAGNOSIS — N390 Urinary tract infection, site not specified: Secondary | ICD-10-CM | POA: Diagnosis not present

## 2020-09-16 LAB — CULTURE, BLOOD (ROUTINE X 2)
Culture: NO GROWTH
Culture: NO GROWTH
Special Requests: ADEQUATE
Special Requests: ADEQUATE

## 2020-09-19 DIAGNOSIS — J984 Other disorders of lung: Secondary | ICD-10-CM | POA: Diagnosis not present

## 2020-09-19 DIAGNOSIS — I2510 Atherosclerotic heart disease of native coronary artery without angina pectoris: Secondary | ICD-10-CM | POA: Diagnosis not present

## 2020-09-19 DIAGNOSIS — R079 Chest pain, unspecified: Secondary | ICD-10-CM | POA: Diagnosis not present

## 2020-09-19 DIAGNOSIS — Z9289 Personal history of other medical treatment: Secondary | ICD-10-CM | POA: Diagnosis not present

## 2020-09-19 DIAGNOSIS — R634 Abnormal weight loss: Secondary | ICD-10-CM | POA: Diagnosis not present

## 2020-09-19 DIAGNOSIS — N858 Other specified noninflammatory disorders of uterus: Secondary | ICD-10-CM | POA: Diagnosis not present

## 2020-09-19 DIAGNOSIS — K5790 Diverticulosis of intestine, part unspecified, without perforation or abscess without bleeding: Secondary | ICD-10-CM | POA: Diagnosis not present

## 2020-09-19 DIAGNOSIS — I700 Atherosclerosis of aorta: Secondary | ICD-10-CM | POA: Diagnosis not present

## 2020-09-19 DIAGNOSIS — N898 Other specified noninflammatory disorders of vagina: Secondary | ICD-10-CM | POA: Diagnosis not present

## 2020-09-19 DIAGNOSIS — R059 Cough, unspecified: Secondary | ICD-10-CM | POA: Diagnosis not present

## 2020-09-19 DIAGNOSIS — R590 Localized enlarged lymph nodes: Secondary | ICD-10-CM | POA: Diagnosis not present

## 2020-09-20 DIAGNOSIS — E46 Unspecified protein-calorie malnutrition: Secondary | ICD-10-CM | POA: Diagnosis not present

## 2020-09-20 DIAGNOSIS — Z299 Encounter for prophylactic measures, unspecified: Secondary | ICD-10-CM | POA: Diagnosis not present

## 2020-09-20 DIAGNOSIS — I10 Essential (primary) hypertension: Secondary | ICD-10-CM | POA: Diagnosis not present

## 2020-09-20 DIAGNOSIS — C539 Malignant neoplasm of cervix uteri, unspecified: Secondary | ICD-10-CM | POA: Diagnosis not present

## 2020-09-20 DIAGNOSIS — I471 Supraventricular tachycardia: Secondary | ICD-10-CM | POA: Diagnosis not present

## 2020-09-21 ENCOUNTER — Ambulatory Visit
Admission: RE | Admit: 2020-09-21 | Discharge: 2020-09-21 | Disposition: A | Discharge status: Home / Self Care | Payer: Self-pay | Source: Ambulatory Visit | Attending: Gynecologic Oncology | Admitting: Gynecologic Oncology

## 2020-09-21 ENCOUNTER — Other Ambulatory Visit: Payer: Self-pay

## 2020-09-21 DIAGNOSIS — N888 Other specified noninflammatory disorders of cervix uteri: Secondary | ICD-10-CM

## 2020-09-22 ENCOUNTER — Telehealth: Payer: Self-pay | Admitting: *Deleted

## 2020-09-22 DIAGNOSIS — Z23 Encounter for immunization: Secondary | ICD-10-CM | POA: Diagnosis not present

## 2020-09-22 NOTE — Telephone Encounter (Signed)
Called the patient's grandson and scheduled a new patient appt for 12/20 at 11:15 am with Dr Berline Lopes. Grandson given the address and phone number for the clinic. Grandson also given the policy for mask and visitors

## 2020-09-23 ENCOUNTER — Encounter: Payer: Self-pay | Admitting: Gynecologic Oncology

## 2020-09-25 NOTE — Progress Notes (Signed)
GYNECOLOGIC ONCOLOGY NEW PATIENT CONSULTATION   Patient Name: Kathy Ford  Patient Age: 86 y.o. 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  I reviewed in detail with the patient and her grandson findings from her recent CT scan as well as my pelvic exam. I suspect that this is either a malignancy originating in the cervix or the uterus. Given adenopathy, it appears that there has been metastasis of this gyn cancer. To help identify the origin of the tumor, multiple biopsies were taken today. If this appears to be an adenocarcinoma, it may be difficult to determine whether it is endocervical or endometrial in origin. Hopefully, tissue stains and review by our radiologists here of her CT scan will help come to a conclusion.  The treatment plan will differ depending on the origin of her cancer. If this appears to be arising from the cervix or endocervix, then we will move forward with definitive chemoradiation. If this appears to be uterine in origin, then we will discuss either primary surgery versus radiation +/- chemotherapy. I will call Kathy Ford once I have the biopsy results back to discuss further.  A copy of this note was sent to the patient's referring provider.   60 minutes of total time was spent for this patient encounter, including preparation, face-to-face counseling with the patient and coordination of care, and documentation of the encounter.   Jeral Pinch, MD  Division of Gynecologic Oncology  Department of Obstetrics and Gynecology  [facility]  ___________________________________________  Chief Complaint: Chief Complaint  Patient presents  with  . Pelvic mass    History of Present Illness:  Kathy Ford is a 86 y.o. y.o. female who is seen in consultation at the request of Darrin Nipper NP for an evaluation of large pelvic mass with evidence of metastatic disease concerning for either cervical or uterine cancer.  Seen most recently at her PCP office on 12/14 to review CT scan performed for decreased appetite, weight loss. Today, she presents to see me with her grandson, Kathy Ford, who lives with her and is her POA. They live together in [place]. The patient describes being in her normal state of health (quite active, able to perform ADLs without difficulty) until 6-8 months ago. She began having early satiety and decreased appetite. She is down from a weight of 125lbs approximately 9 months ago. She denies bloating. More recently, over the last 2-3 months, she has had increasing fatigue and weakness. Now, she is no longer able to drive or to perform many of the activities she previously could do. She uses a walked at home (new over the last 2-3 months); previously, she's use a can as needed when she was out and about. She endorses some pain and burning today with urination, but most denies abdominal or pelvic pain. She has a history of urinary incontinence and has been using a pessary for a number of years. She goes regularly to a gynecologist who is following her and managing her pessary. She had bloody discharge after the pessary was placed. She was told her urine had blood but she has not noted any blood in her urine, vaginal bleeding, or discharge in the last 5-6 months.  She was seen in ED  on 12/2, diagnosed with UTI. Presented again on 12/5 with fever, weakness at home. Treated again for a complicated UTI. Continues to have her baseline urinary incontinence. Has had regular stools (soft) but overall decreased quantity since her PO intake has decreased. She denies any LE edema.   Seen by Dr. Elonda Husky last on 11/8 for pessary check.     She describes a long history of feeling food "get stuck" in her throat when she swallows.  PAST MEDICAL HISTORY:  Past Medical History:  Diagnosis Date  . Anemia   . Arthritis   . Essential hypertension   . GERD (gastroesophageal reflux disease)   . Glaucoma   . PSVT (paroxysmal supraventricular tachycardia) ([place])      PAST SURGICAL HISTORY:  Past Surgical History:  Procedure Laterality Date  . BREAST BIOPSY     x2  . COLONOSCOPY N/A 12/19/2012   Procedure: COLONOSCOPY;  Surgeon: Rogene Houston, MD;  [facility];  Service: Endoscopy;  Laterality: N/A;  1030-moved to 1225 Ann to notify pt  . ESOPHAGOGASTRODUODENOSCOPY  04/23/2012   Procedure: ESOPHAGOGASTRODUODENOSCOPY (EGD);  Surgeon: Rogene Houston, MD;  Location[facility];  Service: Endoscopy;  Laterality: N/A;  1200  . INGUINAL HERNIA REPAIR  1998  . INGUINAL HERNIA REPAIR Left 02/19/2014   Procedure: HERNIA REPAIR INGUINAL ADULT;  Surgeon: Jamesetta So, MD;  Location: [facility] ORS;  Service: General;  Laterality: Left;  . INSERTION OF MESH Left 02/19/2014   Procedure: INSERTION OF MESH;  Surgeon: Jamesetta So, MD;  Location: [facility] ORS;  Service: General;  Laterality: Left;  . JOINT REPLACEMENT Bilateral   . LAPAROSCOPIC CHOLECYSTECTOMY    . TOTAL KNEE ARTHROPLASTY Bilateral    x 2 (both) one in 2010 and 2011    OB/GYN HISTORY:  OB History  Gravida Para Term Preterm AB Living  3 3 3     2   SAB IAB Ectopic Multiple Live Births          3    # Outcome Date GA Lbr Len/2nd Weight Sex Delivery Anes PTL Lv  3 Term     F Vag-Spont   LIV  2 Term     M Vag-Spont   DEC  1 Term     F Vag-Spont   LIV    No LMP recorded. Patient is postmenopausal.  Age at menarche: 89 Age at menopause: 37 Hx of HRT: denies Hx of STDs: denies Last pap: 2017 History of abnormal pap smears: 2017  SCREENING STUDIES:  Last mammogram: 2017  Last colonoscopy: 2014  MEDICATIONS: Outpatient Encounter Medications as of 09/26/2020   Medication Sig  . acetaminophen (TYLENOL) 500 MG tablet Take 1,000 mg by mouth as needed.  Marland Kitchen aspirin 81 MG tablet Take 81 mg by mouth daily.  . Calcium Carbonate (CALTRATE 600 PO) Take 1 tablet by mouth daily.  Marland Kitchen esomeprazole (NEXIUM) 40 MG capsule Take 40 mg by mouth daily before breakfast.  . hydrochlorothiazide (HYDRODIURIL) 25 MG tablet Take 12.5 mg by mouth daily.  . metoprolol succinate (TOPROL-XL) 25 MG 24 hr tablet Take 2 tablets (50 mg total) by mouth in the morning. & 25 mg in the evening  . Multiple Vitamin (MULTIVITAMIN) tablet Take 1 tablet by mouth daily.  . potassium chloride SA (K-DUR,KLOR-CON) 20 MEQ tablet Take 20 mEq by mouth 3 (three) times daily.  . carboxymethylcellulose (REFRESH PLUS) 0.5 % SOLN Place 1 drop into both eyes 2 (two) times daily.  . clindamycin (CLINDAGEL)  1 % gel Apply topically 2 (two) times daily. (Patient not taking: Reported on 09/23/2020)  . EPINEPHrine 0.3 mg/0.3 mL IJ SOAJ injection SMARTSIG:1 Unspecified IM PRN (Patient not taking: Reported on 09/23/2020)  . metroNIDAZOLE (METROGEL VAGINAL) 0.75 % vaginal gel Nightly x 5 nights (Patient not taking: Reported on 09/23/2020)  . [DISCONTINUED] brimonidine (ALPHAGAN) 0.15 % ophthalmic solution Place 1 drop into both eyes 3 (three) times daily.   . [DISCONTINUED] cephALEXin (KEFLEX) 500 MG capsule Take 1 capsule (500 mg total) by mouth 2 (two) times daily.  . [DISCONTINUED] latanoprost (XALATAN) 0.005 % ophthalmic solution Place 1 drop into both eyes daily.   . [DISCONTINUED] olmesartan-hydrochlorothiazide (BENICAR HCT) 40-25 MG tablet Take 1 tablet by mouth daily.   No facility-administered encounter medications on file as of 09/26/2020.    ALLERGIES:  Allergies  Allergen Reactions  . Bee Venom Anaphylaxis and Swelling  . Caine-1 [Lidocaine] Other (See Comments)    novocaine-passed out; due to epinephrine added to novocaine.     FAMILY HISTORY:  Family History  Problem Relation Age of Onset   . Colon polyps Sister   . Colon cancer Sister        alive, 62 years younger  . COPD Mother   . CAD Father   . Cancer Son        stomach - then spread to peritoneum  . Breast cancer Neg Hx   . Ovarian cancer Neg Hx   . Pancreatic cancer Neg Hx   . Prostate cancer Neg Hx   . Endometrial cancer Neg Hx      SOCIAL HISTORY:  Social Connections: Not on file    REVIEW OF SYSTEMS:  + decreased appetite, weight loss, fatigue, chills, shortness of breath, early satiety, frequency, pelvic pain, difficulty walking, difficulty swallowing, low back pain Denies hearing loss, neck lumps or masses, mouth sores, ringing in ears or voice changes. Denies cough or wheezing.   Denies chest pain or palpitations. Denies leg swelling. Denies abdominal distention, pain, blood in stools, constipation, diarrhea, nausea, vomiting. Denies pain with intercourse, dysuria, hematuria or incontinence. Denies hot flashes, vaginal bleeding or vaginal discharge.   Denies joint pain or muscle pain/cramps. Denies itching, rash, or wounds. Denies dizziness, headaches, numbness or seizures. Denies swollen lymph nodes or glands, denies easy bruising or bleeding. Denies anxiety, depression, confusion, or decreased concentration.  Physical Exam:  Vital Signs for this encounter:  Blood pressure (!) 150/81, pulse (!) 51, temperature 99.1 F (37.3 C), temperature source Tympanic, resp. rate 16, height 4\' 11"  (1.499 m), weight 105 lb 12.8 oz (48 kg), SpO2 98 %. Body mass index is 21.37 kg/m. General: Alert, oriented, no acute distress. Mildly cachetic appearing. HEENT: Normocephalic, atraumatic. Sclera anicteric.  Chest: Clear to auscultation bilaterally. No wheezes, rhonchi, or rales. Cardiovascular: Regular rate and rhythm, no murmurs, rubs, or gallops.  Abdomen: Normoactive bowel sounds. Soft, nondistended, nontender to palpation. No hepatosplenomegaly appreciated. No palpable fluid wave. Fullness appreciated in the  mid lower abdomen. Extremities: Grossly normal range of motion. Warm, well perfused. No edema bilaterally.  Skin: No rashes or lesions.  Lymphatics: No cervical, supraclavicular, or inguinal adenopathy.  GU:  Normal external female genitalia. No lesions. Discharge noted prior to pessary removal. Once pessary removed, speculum exam performed. Cervix visualized. 1-2cm area of leukoplakia-appearing, friable mass within the cervical canal, extending onto the cervix from 4-7 o'c.  Mass along posterior and left aspect of the endocervical canal. On bimanual exam, there is a huge mass replacing upper  cervix and LUS, measuring 6-8cm, with parametrial involvement, some mobility side to side. No nodularity appreciated including on RV exam.   Cervical and endometrial BIOPSY PROCEDURE Pre-op diagnosis: cervical/LUS mass Post-op diagnosis: same as above Physician: Berline Lopes MD Specimens: cervical biopsy at 18 o'c, ECC, EMB; placed in formalin EBL: <25cc Procedure: The procedure was described in detail including risks and benefits. After verbal consent was obtained, the patient was placed in dorsal lithotomy and a speculum was placed in the vagina. The cervix was well visualized. It was cleansed with betadine x3. Several biopsies were taken at 73 o'c with Tischler forceps. An ECC was then performed. Given the endocervical mass, the endometrial pipelle was only able to be advanced anteriorly to a depth of 11cm prior to the meeting resistance. Several passes were performed with thick, blood tinged fluid obtained, very little if any tissue noted. All instruments were removed after assuring hemostasis. Patient tolerated the procedure well.    LABORATORY AND RADIOLOGIC DATA:  Outside medical records were reviewed to synthesize the above history, along with the history and physical obtained during the visit.   Lab Results  Component Value Date   WBC 12.4 (H) 09/11/2020   HGB 11.3 (L) 09/11/2020   HCT 35.7 (L) 09/11/2020    PLT 705 (H) 09/11/2020   GLUCOSE 102 (H) 09/11/2020   CHOL 179 06/19/2019   TRIG 57 06/19/2019   HDL 55 06/19/2019   LDLCALC 113 06/19/2019   ALT 9 06/19/2019   AST 16 06/19/2019   NA 133 (L) 09/11/2020   K 3.7 09/11/2020   CL 94 (L) 09/11/2020   CREATININE 0.46 09/11/2020   BUN 13 09/11/2020   CO2 29 09/11/2020   TSH 2.130 06/19/2019   INR 1.33 07/31/2010   CT C/A/P 09/19/20: 1. Heterogeneous mass lesion involving the cervix and lower portion of the uterus extending over a cephalo caudal length of 7 cm with a transverse diameter of 7.7 x 7.0 cm. Blockage of the endometrial canal with hydrometrocolpos seen in the distal canal. Right iliac chain lymphadenopathy with the largest abnormal node measuring 2.8 x 1.6 x 1.5 cm. Findings consistent with cervical or endometrial carcinoma. 2. No evidence of distant metastatic disease, beyond the right iliac chain lymphadenopathy. 3. Aortic atherosclerosis. Coronary artery calcification. 4. Moderate to large amount of fecal matter in the colon without evidence of obstructing lesion. Mild diverticulosis without evidence of diverticulitis.

## 2020-09-26 ENCOUNTER — Inpatient Hospital Stay: Payer: Medicare Other | Attending: Gynecologic Oncology | Admitting: Gynecologic Oncology

## 2020-09-26 ENCOUNTER — Encounter: Payer: Self-pay | Admitting: Gynecologic Oncology

## 2020-09-26 ENCOUNTER — Encounter: Payer: Self-pay | Admitting: Oncology

## 2020-09-26 ENCOUNTER — Other Ambulatory Visit: Payer: Self-pay

## 2020-09-26 VITALS — BP 150/81 | HR 51 | Temp 99.1°F | Resp 16 | Ht 59.0 in | Wt 105.8 lb

## 2020-09-26 DIAGNOSIS — Z79899 Other long term (current) drug therapy: Secondary | ICD-10-CM | POA: Insufficient documentation

## 2020-09-26 DIAGNOSIS — D390 Neoplasm of uncertain behavior of uterus: Secondary | ICD-10-CM | POA: Insufficient documentation

## 2020-09-26 DIAGNOSIS — C519 Malignant neoplasm of vulva, unspecified: Secondary | ICD-10-CM | POA: Diagnosis not present

## 2020-09-26 DIAGNOSIS — M1990 Unspecified osteoarthritis, unspecified site: Secondary | ICD-10-CM | POA: Insufficient documentation

## 2020-09-26 DIAGNOSIS — I471 Supraventricular tachycardia: Secondary | ICD-10-CM | POA: Diagnosis not present

## 2020-09-26 DIAGNOSIS — R5383 Other fatigue: Secondary | ICD-10-CM | POA: Insufficient documentation

## 2020-09-26 DIAGNOSIS — Z7982 Long term (current) use of aspirin: Secondary | ICD-10-CM | POA: Insufficient documentation

## 2020-09-26 DIAGNOSIS — R32 Unspecified urinary incontinence: Secondary | ICD-10-CM | POA: Insufficient documentation

## 2020-09-26 DIAGNOSIS — I10 Essential (primary) hypertension: Secondary | ICD-10-CM | POA: Insufficient documentation

## 2020-09-26 DIAGNOSIS — R6881 Early satiety: Secondary | ICD-10-CM | POA: Diagnosis not present

## 2020-09-26 DIAGNOSIS — K219 Gastro-esophageal reflux disease without esophagitis: Secondary | ICD-10-CM | POA: Insufficient documentation

## 2020-09-26 DIAGNOSIS — R300 Dysuria: Secondary | ICD-10-CM | POA: Diagnosis not present

## 2020-09-26 DIAGNOSIS — R531 Weakness: Secondary | ICD-10-CM | POA: Diagnosis not present

## 2020-09-26 DIAGNOSIS — R590 Localized enlarged lymph nodes: Secondary | ICD-10-CM | POA: Insufficient documentation

## 2020-09-26 DIAGNOSIS — R1900 Intra-abdominal and pelvic swelling, mass and lump, unspecified site: Secondary | ICD-10-CM

## 2020-09-26 DIAGNOSIS — C530 Malignant neoplasm of endocervix: Secondary | ICD-10-CM | POA: Diagnosis not present

## 2020-09-26 NOTE — Progress Notes (Signed)
Met with Kathy Ford and her grandson.  Provided them with the [facility] folder and upcoming appointment with Dr. Alvy Bimler on 09/29/20 at 1:00 (arrival at 12:30).  Encouraged them to call with any questions or needs.

## 2020-09-26 NOTE — Patient Instructions (Signed)
It was a pleasure meeting you today. Your CT exam and my exam today are concerning for either a cervix or uterine cancer. I will call you as soon as the biopsies from today are back. We will then discuss a treatment plan when we know what type of cancer this is and what female organ it is coming from.

## 2020-09-28 DIAGNOSIS — C539 Malignant neoplasm of cervix uteri, unspecified: Secondary | ICD-10-CM | POA: Diagnosis not present

## 2020-09-28 DIAGNOSIS — R627 Adult failure to thrive: Secondary | ICD-10-CM | POA: Diagnosis not present

## 2020-09-28 DIAGNOSIS — Z299 Encounter for prophylactic measures, unspecified: Secondary | ICD-10-CM | POA: Diagnosis not present

## 2020-09-28 DIAGNOSIS — R5383 Other fatigue: Secondary | ICD-10-CM | POA: Diagnosis not present

## 2020-09-29 ENCOUNTER — Encounter: Payer: Self-pay | Admitting: Hematology and Oncology

## 2020-09-29 ENCOUNTER — Emergency Department (HOSPITAL_COMMUNITY): Payer: Medicare Other

## 2020-09-29 ENCOUNTER — Encounter (HOSPITAL_COMMUNITY): Payer: Self-pay

## 2020-09-29 ENCOUNTER — Other Ambulatory Visit: Payer: Self-pay

## 2020-09-29 ENCOUNTER — Emergency Department (HOSPITAL_COMMUNITY)
Admission: EM | Admit: 2020-09-29 | Discharge: 2020-09-29 | Disposition: A | Discharge status: Home / Self Care | Payer: Medicare Other | Attending: Emergency Medicine | Admitting: Emergency Medicine

## 2020-09-29 ENCOUNTER — Telehealth: Payer: Self-pay | Admitting: Gynecologic Oncology

## 2020-09-29 ENCOUNTER — Inpatient Hospital Stay (HOSPITAL_BASED_OUTPATIENT_CLINIC_OR_DEPARTMENT_OTHER): Payer: Medicare Other | Admitting: Hematology and Oncology

## 2020-09-29 ENCOUNTER — Other Ambulatory Visit: Payer: Self-pay | Admitting: Physician Assistant

## 2020-09-29 DIAGNOSIS — E876 Hypokalemia: Secondary | ICD-10-CM

## 2020-09-29 DIAGNOSIS — R64 Cachexia: Secondary | ICD-10-CM

## 2020-09-29 DIAGNOSIS — I10 Essential (primary) hypertension: Secondary | ICD-10-CM | POA: Diagnosis not present

## 2020-09-29 DIAGNOSIS — I4891 Unspecified atrial fibrillation: Secondary | ICD-10-CM | POA: Diagnosis not present

## 2020-09-29 DIAGNOSIS — D75838 Other thrombocytosis: Secondary | ICD-10-CM

## 2020-09-29 DIAGNOSIS — R5383 Other fatigue: Secondary | ICD-10-CM | POA: Diagnosis not present

## 2020-09-29 DIAGNOSIS — D390 Neoplasm of uncertain behavior of uterus: Secondary | ICD-10-CM | POA: Diagnosis not present

## 2020-09-29 DIAGNOSIS — R6881 Early satiety: Secondary | ICD-10-CM | POA: Diagnosis not present

## 2020-09-29 DIAGNOSIS — I471 Supraventricular tachycardia: Secondary | ICD-10-CM | POA: Diagnosis not present

## 2020-09-29 DIAGNOSIS — R627 Adult failure to thrive: Secondary | ICD-10-CM | POA: Diagnosis not present

## 2020-09-29 DIAGNOSIS — R531 Weakness: Secondary | ICD-10-CM | POA: Diagnosis not present

## 2020-09-29 DIAGNOSIS — C55 Malignant neoplasm of uterus, part unspecified: Secondary | ICD-10-CM | POA: Diagnosis not present

## 2020-09-29 DIAGNOSIS — R590 Localized enlarged lymph nodes: Secondary | ICD-10-CM | POA: Diagnosis not present

## 2020-09-29 DIAGNOSIS — Z7982 Long term (current) use of aspirin: Secondary | ICD-10-CM | POA: Insufficient documentation

## 2020-09-29 DIAGNOSIS — Z96653 Presence of artificial knee joint, bilateral: Secondary | ICD-10-CM | POA: Diagnosis not present

## 2020-09-29 DIAGNOSIS — Z79899 Other long term (current) drug therapy: Secondary | ICD-10-CM | POA: Diagnosis not present

## 2020-09-29 DIAGNOSIS — Z20822 Contact with and (suspected) exposure to covid-19: Secondary | ICD-10-CM | POA: Diagnosis not present

## 2020-09-29 DIAGNOSIS — D500 Iron deficiency anemia secondary to blood loss (chronic): Secondary | ICD-10-CM | POA: Insufficient documentation

## 2020-09-29 DIAGNOSIS — J90 Pleural effusion, not elsewhere classified: Secondary | ICD-10-CM | POA: Diagnosis not present

## 2020-09-29 DIAGNOSIS — C519 Malignant neoplasm of vulva, unspecified: Secondary | ICD-10-CM | POA: Diagnosis not present

## 2020-09-29 LAB — COMPREHENSIVE METABOLIC PANEL
ALT: 61 U/L — ABNORMAL HIGH (ref 0–44)
AST: 70 U/L — ABNORMAL HIGH (ref 15–41)
Albumin: 2 g/dL — ABNORMAL LOW (ref 3.5–5.0)
Alkaline Phosphatase: 96 U/L (ref 38–126)
Anion gap: 13 (ref 5–15)
BUN: 21 mg/dL (ref 8–23)
CO2: 24 mmol/L (ref 22–32)
Calcium: 8.5 mg/dL — ABNORMAL LOW (ref 8.9–10.3)
Chloride: 96 mmol/L — ABNORMAL LOW (ref 98–111)
Creatinine, Ser: 0.52 mg/dL (ref 0.44–1.00)
GFR, Estimated: >60 mL/min (ref >60)
Glucose, Bld: 133 mg/dL — ABNORMAL HIGH (ref 70–99)
Potassium: 3.6 mmol/L (ref 3.5–5.1)
Sodium: 133 mmol/L — ABNORMAL LOW (ref 135–145)
Total Bilirubin: 0.4 mg/dL (ref 0.3–1.2)
Total Protein: 7.1 g/dL (ref 6.5–8.1)

## 2020-09-29 LAB — CBC WITH DIFFERENTIAL/PLATELET
Abs Immature Granulocytes: 0.09 10*3/uL — ABNORMAL HIGH (ref 0.00–0.07)
Basophils Absolute: 0 10*3/uL (ref 0.0–0.1)
Basophils Relative: 0 %
Eosinophils Absolute: 0 10*3/uL (ref 0.0–0.5)
Eosinophils Relative: 0 %
HCT: 34.7 % — ABNORMAL LOW (ref 36.0–46.0)
Hemoglobin: 10.7 g/dL — ABNORMAL LOW (ref 12.0–15.0)
Immature Granulocytes: 1 %
Lymphocytes Relative: 11 %
Lymphs Abs: 1.8 10*3/uL (ref 0.7–4.0)
MCH: 28.5 pg (ref 26.0–34.0)
MCHC: 30.8 g/dL (ref 30.0–36.0)
MCV: 92.3 fL (ref 80.0–100.0)
Monocytes Absolute: 1.4 10*3/uL — ABNORMAL HIGH (ref 0.1–1.0)
Monocytes Relative: 9 %
Neutro Abs: 12.3 10*3/uL — ABNORMAL HIGH (ref 1.7–7.7)
Neutrophils Relative %: 79 %
Platelets: 869 10*3/uL — ABNORMAL HIGH (ref 150–400)
RBC: 3.76 MIL/uL — ABNORMAL LOW (ref 3.87–5.11)
RDW: 14.7 % (ref 11.5–15.5)
WBC: 15.7 10*3/uL — ABNORMAL HIGH (ref 4.0–10.5)
nRBC: 0 % (ref 0.0–0.2)

## 2020-09-29 LAB — RESP PANEL BY RT-PCR (FLU A&B, COVID) ARPGX2
Influenza A by PCR: NEGATIVE
Influenza B by PCR: NEGATIVE
SARS Coronavirus 2 by RT PCR: NEGATIVE

## 2020-09-29 LAB — MAGNESIUM: Magnesium: 2 mg/dL (ref 1.7–2.4)

## 2020-09-29 LAB — TROPONIN I (HIGH SENSITIVITY): Troponin I (High Sensitivity): 9 ng/L (ref <18)

## 2020-09-29 MED ORDER — FLECAINIDE ACETATE 50 MG PO TABS: 50.0000 mg | ORAL_TABLET | Freq: Two times a day (BID) | ORAL | Status: DC

## 2020-09-29 MED ORDER — FLECAINIDE ACETATE 50 MG PO TABS
50.0000 mg | ORAL_TABLET | Freq: Once | ORAL | Status: AC
  Administered 2020-09-29: 19:00:00 50 mg via ORAL
  Filled 2020-09-29: qty 1

## 2020-09-29 MED ORDER — METOPROLOL TARTRATE 5 MG/5ML IV SOLN
2.5000 mg | Freq: Once | INTRAVENOUS | Status: AC
  Administered 2020-09-29: 16:00:00 2.5 mg via INTRAVENOUS
  Filled 2020-09-29: qty 5

## 2020-09-29 MED ORDER — SODIUM CHLORIDE 0.9 % IV BOLUS
1000.0000 mL | Freq: Once | INTRAVENOUS | Status: AC
  Administered 2020-09-29: 16:00:00 1000 mL via INTRAVENOUS

## 2020-09-29 MED ORDER — POTASSIUM CHLORIDE CRYS ER 20 MEQ PO TBCR
40.0000 meq | EXTENDED_RELEASE_TABLET | Freq: Once | ORAL | Status: AC
  Administered 2020-09-29: 19:00:00 40 meq via ORAL
  Filled 2020-09-29: qty 2

## 2020-09-29 MED ORDER — DEXAMETHASONE 4 MG PO TABS: 4.0000 mg | ORAL_TABLET | Freq: Every day | ORAL | 1 refills | Status: AC

## 2020-09-29 MED ORDER — METOPROLOL TARTRATE 5 MG/5ML IV SOLN
2.5000 mg | Freq: Once | INTRAVENOUS | Status: AC
  Administered 2020-09-29: 18:00:00 2.5 mg via INTRAVENOUS
  Filled 2020-09-29: qty 5

## 2020-09-29 MED ORDER — FLECAINIDE ACETATE 50 MG PO TABS: 50.0000 mg | ORAL_TABLET | Freq: Two times a day (BID) | ORAL | 0 refills | Status: AC

## 2020-09-29 NOTE — Assessment & Plan Note (Addendum)
I have briefly reviewed her outside CT imaging Her case will be discussed at the next GYN oncology tumor board It is likely she might have uterine cancer although the biopsy was obtained through her cervix With her ongoing bleeding, she might benefit from chemoradiation treatment Unfortunately, during the exam, she was found to have atrial fibrillation with rapid ventricular response She is directed to the emergency department for further management I will see her back after her discharge to resume discussion about treatment options for her recent diagnosis of cancer

## 2020-09-29 NOTE — ED Provider Notes (Signed)
[place] DEPT Provider Note   CSN: [id number] Arrival date & time: 09/29/20  1407     History Chief Complaint  Patient presents with  . Atrial Fibrillation    Kathy Ford is a 86 y.o. female.  HPI 86 year old female presents from cancer center with new A. fib.  Time of onset is unclear as the patient states she has had palpitations on and off for a while and is currently being treated with metoprolol.  She does not feel any current symptoms of palpitations.  She felt a little jittery this morning but has had that on and off with her new meds.  She has been having night sweats and fevers for months.  Recently diagnosed with cervical cancer.  The patient has been having exertional shortness of breath for months as well but this is not new either.  No significant cough or chest pain.  No leg swelling.  Has been having decreased p.o. intake.   Past Medical History:  Diagnosis Date  . Anemia   . Arthritis   . Essential hypertension   . GERD (gastroesophageal reflux disease)   . Glaucoma   . PSVT (paroxysmal supraventricular tachycardia) [facility])     Patient Active Problem List   Diagnosis Date Noted  . Uterine cancer ([place]) 09/29/2020  . Iron deficiency anemia due to chronic blood loss 09/29/2020  . Reactive thrombocytosis 09/29/2020  . Malignant cachexia ([place]) 09/29/2020  . Atrial fibrillation with RVR ([place]) 09/29/2020  . Closed fracture of left proximal humerus 03/15/19 03/23/2019  . GERD (gastroesophageal reflux disease) 03/25/2012  . HYPERLIPIDEMIA-MIXED 03/25/2009  . HYPERTENSION, UNSPECIFIED 03/25/2009    Past Surgical History:  Procedure Laterality Date  . BREAST BIOPSY     x2  . COLONOSCOPY N/A 12/19/2012   Procedure: COLONOSCOPY;  Surgeon: Rogene Houston, MD;  [facility];  Service: Endoscopy;  Laterality: N/A;  1030-moved to 1225 Ann to notify pt  . ESOPHAGOGASTRODUODENOSCOPY  04/23/2012   Procedure:  ESOPHAGOGASTRODUODENOSCOPY (EGD);  Surgeon: Rogene Houston, MD;  [facility];  Service: Endoscopy;  Laterality: N/A;  1200  . INGUINAL HERNIA REPAIR  1998  . INGUINAL HERNIA REPAIR Left 02/19/2014   Procedure: HERNIA REPAIR INGUINAL ADULT;  Surgeon: Jamesetta So, MD;  Location: [facility] ORS;  Service: General;  Laterality: Left;  . INSERTION OF MESH Left 02/19/2014   Procedure: INSERTION OF MESH;  Surgeon: Jamesetta So, MD;  Location: [facility] ORS;  Service: General;  Laterality: Left;  . JOINT REPLACEMENT Bilateral   . LAPAROSCOPIC CHOLECYSTECTOMY    . TOTAL KNEE ARTHROPLASTY Bilateral    x 2 (both) one in 2010 and 2011     OB History    Gravida  3   Para  3   Term  3   Preterm      AB      Living  2     SAB      IAB      Ectopic      Multiple      Live Births  3           Family History  Problem Relation Age of Onset  . Colon polyps Sister   . Colon cancer Sister        alive, 53 years younger  . COPD Mother   . CAD Father   . Cancer Son        stomach - then spread to peritoneum  . Breast  cancer Neg Hx   . Ovarian cancer Neg Hx   . Pancreatic cancer Neg Hx   . Prostate cancer Neg Hx   . Endometrial cancer Neg Hx     Social History   Tobacco Use  . Smoking status: Never Smoker  . Smokeless tobacco: Never Used  Vaping Use  . Vaping Use: Never used  Substance Use Topics  . Alcohol use: No  . Drug use: No    Home Medications Prior to Admission medications   Medication Sig Start Date End Date Taking? Authorizing Provider  acetaminophen (TYLENOL) 500 MG tablet Take 500-1,000 mg by mouth every 6 (six) hours as needed for moderate pain.   Yes [provider]  aspirin 81 MG tablet Take 81 mg by mouth daily.   Yes [provider]  Calcium Carbonate (CALTRATE 600 PO) Take 1 tablet by mouth daily.   Yes [provider]  carboxymethylcellulose (REFRESH PLUS) 0.5 % SOLN Place 1 drop into both eyes 2 (two) times daily as  needed (dry eyes).   Yes [provider]  EPINEPHrine 0.3 mg/0.3 mL IJ SOAJ injection Inject into the muscle as needed for anaphylaxis. 04/16/20  Yes [provider]  esomeprazole (NEXIUM) 40 MG capsule Take 40 mg by mouth daily before breakfast.   Yes [provider]  metoprolol succinate (TOPROL-XL) 25 MG 24 hr tablet Take 2 tablets (50 mg total) by mouth in the morning. & 25 mg in the evening 06/09/20  Yes Jonelle SidleMcDowell, Samuel G, MD  Multiple Vitamin (MULTIVITAMIN) tablet Take 1 tablet by mouth daily.   Yes [provider]  potassium chloride SA (KLOR-CON) 20 MEQ tablet Take 20 mEq by mouth 3 (three) times daily.   Yes [provider]  dexamethasone (DECADRON) 4 MG tablet Take 1 tablet (4 mg total) by mouth daily. 09/29/20   Artis DelayGorsuch, Ni, MD  flecainide (TAMBOCOR) 50 MG tablet Take 1 tablet (50 mg total) by mouth 2 (two) times daily. 09/29/20   Pricilla LovelessGoldston, Cyndee Giammarco, MD    Allergies    Bee venom and Caine-1 [lidocaine]  Review of Systems   Review of Systems  Constitutional: Positive for diaphoresis and fever.  Respiratory: Positive for shortness of breath. Negative for cough.   Cardiovascular: Negative for chest pain, palpitations and leg swelling.  Gastrointestinal: Positive for diarrhea (today). Negative for abdominal pain and vomiting.  All other systems reviewed and are negative.   Physical Exam Updated Vital Signs BP 120/76   Pulse 100   Temp 98.3 F (36.8 C) (Oral)   Resp 18   SpO2 95%   Physical Exam Vitals and nursing note reviewed.  Constitutional:      General: She is not in acute distress.    Appearance: She is well-developed and well-nourished. She is not ill-appearing or diaphoretic.  HENT:     Head: Normocephalic and atraumatic.     Right Ear: External ear normal.     Left Ear: External ear normal.     Nose: Nose normal.  Eyes:     General:        Right eye: No discharge.        Left eye: No discharge.  Cardiovascular:      Rate and Rhythm: Tachycardia present. Rhythm irregular.     Heart sounds: Normal heart sounds.  Pulmonary:     Effort: Pulmonary effort is normal.     Breath sounds: Normal breath sounds.  Abdominal:     General: There is no distension.  Palpations: Abdomen is soft.     Tenderness: There is no abdominal tenderness.  Musculoskeletal:     Right lower leg: No edema.     Left lower leg: No edema.  Skin:    General: Skin is warm and dry.  Neurological:     Mental Status: She is alert.  Psychiatric:        Mood and Affect: Mood is not anxious.     ED Results / Procedures / Treatments   Labs (all labs ordered are listed, but only abnormal results are displayed) Labs Reviewed  COMPREHENSIVE METABOLIC PANEL - Abnormal; Notable for the following components:      Result Value   Sodium 133 (*)    Chloride 96 (*)    Glucose, Bld 133 (*)    Calcium 8.5 (*)    Albumin 2.0 (*)    AST 70 (*)    ALT 61 (*)    All other components within normal limits  CBC WITH DIFFERENTIAL/PLATELET - Abnormal; Notable for the following components:   WBC 15.7 (*)    RBC 3.76 (*)    Hemoglobin 10.7 (*)    HCT 34.7 (*)    Platelets 869 (*)    Neutro Abs 12.3 (*)    Monocytes Absolute 1.4 (*)    Abs Immature Granulocytes 0.09 (*)    All other components within normal limits  RESP PANEL BY RT-PCR (FLU A&B, COVID) ARPGX2  MAGNESIUM  URINALYSIS, ROUTINE W REFLEX MICROSCOPIC  TROPONIN I (HIGH SENSITIVITY)  TROPONIN I (HIGH SENSITIVITY)    EKG EKG Interpretation  Date/Time:  Thursday September 29 2020 14:21:22 EST Ventricular Rate:  133 PR Interval:    QRS Duration: 92 QT Interval:  306 QTC Calculation: 438 R Axis:   74 Text Interpretation: Atrial fibrillation with rvr Ventricular premature complex Minimal ST depression, inferior leads similar to earlier in the day Confirmed by Sherwood Gambler [phone]) on 09/29/2020 2:57:57 PM   Radiology DG Chest Portable 1 View  Result Date:  09/29/2020 CLINICAL DATA:  New onset atrial fibrillation. EXAM: PORTABLE CHEST 1 VIEW COMPARISON:  September 11, 2020 FINDINGS: The heart size is mildly enlarged but stable from prior study. Aortic calcifications are noted. There is no pneumothorax. There may be small bilateral pleural effusions, left greater than right. This appears stable since prior study. There is an old healed left humeral fracture. No definite acute displaced fracture identified on today's study. IMPRESSION: 1. Stable cardiac silhouette. Small bilateral pleural effusions, left greater than right. 2. No acute cardiopulmonary process. Electronically Signed   By: Constance Holster M.D.   On: 09/29/2020 15:47    Procedures Procedures (including critical care time)  Medications Ordered in ED Medications  potassium chloride SA (KLOR-CON) CR tablet 40 mEq (has no administration in time range)  flecainide (TAMBOCOR) tablet 50 mg (has no administration in time range)  sodium chloride 0.9 % bolus 1,000 mL (0 mLs Intravenous Stopped 09/29/20 1735)  metoprolol tartrate (LOPRESSOR) injection 2.5 mg (2.5 mg Intravenous Given 09/29/20 1629)  metoprolol tartrate (LOPRESSOR) injection 2.5 mg (2.5 mg Intravenous Given 09/29/20 1732)    ED Course  I have reviewed the triage vital signs and the nursing notes.  Pertinent labs & imaging results that were available during my care of the patient were reviewed by me and considered in my medical decision making (see chart for details).    MDM Rules/Calculators/A&P  Patient's HR has improved with fluids and small doses of metoprolol.  Cardiology saw patient and after closer review of her ECG it appears that there are P waves and this is likely multifocal atrial tachycardia.  Thus she does not need anticoagulation according to cardiology.  They are recommending flecainide 50 mg twice daily.  We will give a dose of potassium now as well given borderline low potassium.   However now her heart rate is in the 90s/low 100s and her vitals are otherwise stable.  She appears stable for discharge home for close outpatient cardiology follow-up which cardiology will arrange.  Return precautions discussed. Final Clinical Impression(s) / ED Diagnoses Final diagnoses:  Multifocal atrial tachycardia ([place])    Rx / DC Orders ED Discharge Orders         Ordered    flecainide (TAMBOCOR) 50 MG tablet  2 times daily        09/29/20 1802           Sherwood Gambler, MD 09/29/20 [phone]

## 2020-09-29 NOTE — Assessment & Plan Note (Signed)
The patient have occasional palpitation EKG today confirm atrial fibrillation with rapid ventricular response I have reviewed the case with cardiology on-call and she recommended ER admission

## 2020-09-29 NOTE — ED Triage Notes (Signed)
Pt coming from the cancer center. Pt was told to come to ER after getting a EKG reading for a-fib which is new for her. Pt is newly dx with cancer this past week (cervical vs endometrial CA).

## 2020-09-29 NOTE — Progress Notes (Signed)
[name]  Patient Care Team: Glenda Chroman, MD as PCP - General (Internal Medicine) Satira Sark, MD as PCP - Cardiology (Cardiology)  ASSESSMENT & PLAN:  Uterine cancer [facility]) I have briefly reviewed her outside CT imaging Her case will be discussed at the next GYN oncology tumor board It is likely she might have uterine cancer although the biopsy was obtained through her cervix With her ongoing bleeding, she might benefit from chemoradiation treatment Unfortunately, during the exam, she was found to have atrial fibrillation with rapid ventricular response She is directed to the emergency department for further management I will see her back after her discharge to resume discussion about treatment options for her recent diagnosis of cancer  Iron deficiency anemia due to chronic blood loss She has profound anemia due to blood loss I suspect she might have iron deficiency Due to her difficulties with eating, I suspect she might not be able to tolerate oral iron She might benefit from IV iron in the future  Reactive thrombocytosis She has significant reactive thrombocytosis likely due to her untreated cancer and possible iron deficiency She will continue aspirin therapy for now  Malignant cachexia [facility]) She has profound malignant cachexia and did not tolerate mirtazapine I recommend a trial of low-dose steroids We discussed the importance of frequent small meals She might benefit from dietitian review for high calorie intake  Atrial fibrillation with RVR ([place]) The patient have occasional palpitation EKG today confirm atrial fibrillation with rapid ventricular response I have reviewed the case with cardiology on-call and she recommended ER admission    No orders of the defined types were placed in this encounter.   The total time spent in the appointment was 60 minutes encounter with patients including review of chart and various tests  results, discussions about plan of care and coordination of care plan   All questions were answered. The patient knows to call the clinic with any problems, questions or concerns. No barriers to learning was detected.  Kathy Lark, MD 12/23/20212:08 PM  CHIEF COMPLAINTS/PURPOSE OF CONSULTATION:  Uterine cancer, for further management  HISTORY OF PRESENTING ILLNESS:  Kathy Ford 86 y.o. female is here because of diagnosis of cancer I have reviewed her chart and materials related to her cancer extensively and collaborated history with the patient.  She is here today accompanied by her grandson who is her healthcare power of attorney The patient was previously functional However, starting around some of this year, she had progressive decline in performance status She has anorexia and progressive weight loss  She has lost approximately 20 pounds She also developed low-grade fever with frequent night sweats The highest temperature is around 103 She also developed recurrent urinary tract infection and abnormal vaginal bleeding/discharge She was seen by primary care doctor and had CT imaging that came back abnormal She underwent biopsy recently after findings of pelvic mass and results show poorly differentiated carcinoma Differential diagnosis at this point is likely either uterine cancer or cervical cancer She is referred here for medical oncology consult to discuss the role of chemotherapy She felt profoundly weak with occasional dizziness She has occasional nausea She is constipated with bowel movement roughly every 4 days or so She was started on mirtazapine but then slept for over a day due to sedative effects from mirtazapine She denies recent chest pain or shortness of breath  MEDICAL HISTORY:  Past Medical History:  Diagnosis Date  . Anemia   .  Arthritis   . Essential hypertension   . GERD (gastroesophageal reflux disease)   . Glaucoma   . PSVT (paroxysmal  supraventricular tachycardia) ([place])     SURGICAL HISTORY: Past Surgical History:  Procedure Laterality Date  . BREAST BIOPSY     x2  . COLONOSCOPY N/A 12/19/2012   Procedure: COLONOSCOPY;  Surgeon: Rogene Houston, MD;  [facility];  Service: Endoscopy;  Laterality: N/A;  1030-moved to 1225 Ann to notify pt  . ESOPHAGOGASTRODUODENOSCOPY  04/23/2012   Procedure: ESOPHAGOGASTRODUODENOSCOPY (EGD);  Surgeon: Rogene Houston, MD;  [facility];  Service: Endoscopy;  Laterality: N/A;  1200  . INGUINAL HERNIA REPAIR  1998  . INGUINAL HERNIA REPAIR Left 02/19/2014   Procedure: HERNIA REPAIR INGUINAL ADULT;  Surgeon: Jamesetta So, MD;  Location: [facility] ORS;  Service: General;  Laterality: Left;  . INSERTION OF MESH Left 02/19/2014   Procedure: INSERTION OF MESH;  Surgeon: Jamesetta So, MD;  Location: [facility];  Service: General;  Laterality: Left;  . JOINT REPLACEMENT Bilateral   . LAPAROSCOPIC CHOLECYSTECTOMY    . TOTAL KNEE ARTHROPLASTY Bilateral    x 2 (both) one in 2010 and 2011    SOCIAL HISTORY: Social History   Socioeconomic History  . Marital status: Widowed    Spouse name: Not on file  . Number of children: Not on file  . Years of education: Not on file  . Highest education level: Not on file  Occupational History  . Occupation: Retired - [employer]  . Smoking status: Never Smoker  . Smokeless tobacco: Never Used  Vaping Use  . Vaping Use: Never used  Substance and Sexual Activity  . Alcohol use: No  . Drug use: No  . Sexual activity: Not Currently  Other Topics Concern  . Not on file  Social History Narrative   Married   Gets regular exercise   Social Determinants of Health   Financial Resource Strain: Not on file  Food Insecurity: Not on file  Transportation Needs: Not on file  Physical Activity: Not on file  Stress: Not on file  Social Connections: Not on file  Intimate Partner Violence: Not on file     FAMILY HISTORY: Family History  Problem Relation Age of Onset  . Colon polyps Sister   . Colon cancer Sister        alive, 97 years younger  . COPD Mother   . CAD Father   . Cancer Son        stomach - then spread to peritoneum  . Breast cancer Neg Hx   . Ovarian cancer Neg Hx   . Pancreatic cancer Neg Hx   . Prostate cancer Neg Hx   . Endometrial cancer Neg Hx     ALLERGIES:  is allergic to bee venom and caine-1 [lidocaine].  MEDICATIONS:  Current Outpatient Medications  Medication Sig Dispense Refill  . acetaminophen (TYLENOL) 500 MG tablet Take 1,000 mg by mouth as needed.    Marland Kitchen aspirin 81 MG tablet Take 81 mg by mouth daily.    . Calcium Carbonate (CALTRATE 600 PO) Take 1 tablet by mouth daily.    . carboxymethylcellulose (REFRESH PLUS) 0.5 % SOLN Place 1 drop into both eyes 2 (two) times daily.    Marland Kitchen dexamethasone (DECADRON) 4 MG tablet Take 1 tablet (4 mg total) by mouth daily. 30 tablet 1  . EPINEPHrine 0.3 mg/0.3 mL IJ SOAJ injection SMARTSIG:1 Unspecified IM  PRN (Patient not taking: Reported on 09/23/2020)    . esomeprazole (NEXIUM) 40 MG capsule Take 40 mg by mouth daily before breakfast.    . metoprolol succinate (TOPROL-XL) 25 MG 24 hr tablet Take 2 tablets (50 mg total) by mouth in the morning. & 25 mg in the evening 270 tablet 2  . Multiple Vitamin (MULTIVITAMIN) tablet Take 1 tablet by mouth daily.     No current facility-administered medications for this visit.    REVIEW OF SYSTEMS:   Eyes: Denies blurriness of vision, double vision or watery eyes Ears, nose, mouth, throat, and face: Denies mucositis or sore throat Respiratory: Denies cough, dyspnea or wheezes Cardiovascular: Denies palpitation, chest discomfort or lower extremity swelling Gastrointestinal:  Denies nausea, heartburn or change in bowel habits Skin: Denies abnormal skin rashes Lymphatics: Denies new lymphadenopathy or easy bruising Behavioral/Psych: Mood is stable, no new changes  All  other systems were reviewed with the patient and are negative.  PHYSICAL EXAMINATION: ECOG PERFORMANCE STATUS: 2 - Symptomatic, <50% confined to bed  Vitals:   09/29/20 1258  BP: 128/90  Pulse: 63  Resp: 18  Temp: 98.8 F (37.1 C)  SpO2: 100%   Filed Weights   09/29/20 1258  Weight: 103 lb (46.7 kg)    GENERAL:alert, no distress and comfortable.  She looks thin and frail SKIN: skin color, texture, turgor are normal, no rashes or significant lesions EYES: normal, conjunctiva are pink and non-injected, sclera clear OROPHARYNX:no exudate, no erythema and lips, buccal mucosa, and tongue normal  NECK: supple, thyroid normal size, non-tender, without nodularity LYMPH:  no palpable lymphadenopathy in the cervical, axillary or inguinal LUNGS: clear to auscultation and percussion with normal breathing effort HEART: irregular rate & rhythm and no murmurs and no lower extremity edema ABDOMEN:abdomen soft, non-tender and normal bowel sounds Musculoskeletal:no cyanosis of digits and no clubbing  PSYCH: alert & oriented x 3 with fluent speech NEURO: no focal motor/sensory deficits  LABORATORY DATA:  I have reviewed the data as listed Lab Results  Component Value Date   WBC 12.4 (H) 09/11/2020   HGB 11.3 (L) 09/11/2020   HCT 35.7 (L) 09/11/2020   MCV 94.7 09/11/2020   PLT 705 (H) 09/11/2020   Recent Labs    09/08/20 2222 09/11/20 1301  NA 130* 133*  K 3.0* 3.7  CL 92* 94*  CO2 27 29  GLUCOSE 143* 102*  BUN 16 13  CREATININE 0.65 0.46  CALCIUM 8.9 9.0  GFRNONAA >60 >60   I have order EKG in my office which show rapid atrial fibrillation RADIOGRAPHIC STUDIES: I have personally reviewed the radiological images as listed and agreed with the findings in the report. DG Chest Portable 1 View  Result Date: 09/11/2020 CLINICAL DATA:  Fever and cough EXAM: PORTABLE CHEST 1 VIEW COMPARISON:  09/08/2020 FINDINGS: Cardiac shadow is mildly prominent. Aortic calcifications are again  seen. The lungs are well aerated bilaterally. No focal infiltrate is seen. Minimal blunting of the left costophrenic angle is noted. IMPRESSION: Small left pleural effusion new from the prior exam. Electronically Signed   By: Inez Catalina M.D.   On: 09/11/2020 12:56   DG Chest Port 1 View  Result Date: 09/08/2020 CLINICAL DATA:  Fevers EXAM: PORTABLE CHEST 1 VIEW COMPARISON:  06/07/2020 FINDINGS: Cardiac shadow is enlarged in size. Aortic calcifications are again noted. The lungs are well aerated bilaterally. No focal infiltrate or sizable effusion is seen. No bony abnormality is noted IMPRESSION: No active disease. Electronically Signed  By: Inez Catalina M.D.   On: 09/08/2020 23:36

## 2020-09-29 NOTE — Assessment & Plan Note (Signed)
She has profound anemia due to blood loss I suspect she might have iron deficiency Due to her difficulties with eating, I suspect she might not be able to tolerate oral iron She might benefit from IV iron in the future

## 2020-09-29 NOTE — Telephone Encounter (Signed)
Kathy Ford, discussed biopsy results. We will discuss patient's case at tumor board - somewhat complicated by the new diagnosis of a fib with RVR.   Jeral Pinch MD Gynecologic Oncology

## 2020-09-29 NOTE — Consult Note (Signed)
Cardiology Consultation:   Patient ID: Kathy Ford MRN: [id number]; DOB: [birth year]  Admit date: 09/29/2020 Date of Consult: 09/29/2020  Primary Care Provider: Glenda Chroman, MD [facility] Cardiologist: Kathy Lesches, MD    Patient Profile:   Kathy Ford is a 86 y.o. female with a hx of PSVT, uterine cancer, IDA, HTN and thrombocytosis  who is being seen today for the evaluation of SVT with RVR  at the request of Dr. Regenia Ford.   The patient worn monitor for palpitations in September 2021 showing PSVT. Established care with Dr. Domenic Ford 06/2020. Recommended to continue Toprol XL 50mg  AM and 25mg  PM. Per note, LVEF 60% by echo.   History of Present Illness:   Kathy Ford has progressive decline over past one year. She lost weight and had abnormal vaginal bleeding/discharge. CT showed pelvic mass and underwent biopsy showing Uterine cancer. Today during oncology appointment she was noted to be in SVT irregular rhythm with rapid ventricular response up to 150 bpm.  Dr. Alvy Bimler called cardiology and was advised to send patient to [facility], ER.    Patient is accompanied by a very sweet and knowledgeable grandson, they explained that she has lost 30 pounds in the last year, she has absolutely no appetite but is forcing herself to drink electrolyte drinks such as Gatorade she was recently prescribed antidepressant in order to improve her appetite, these led to profound fatigue but no improvement in her appetite, she continues to lose weight, she has no nausea or vomiting, she does not feel palpitations, she denies any prior chest pain or shortness of breath.  She used to be very active and exercising with no symptoms however the significant weight loss has led to significant weakness and inability to perform even light activities of daily living, her grandson had to wheelchair her to ER.  No prior presyncope or syncope.  She uses potassium supplements at home as she is chronically  hypokalemic.  Creatinine normal K 3.6 Minimally elevated LFTS Hs-Troponin 9 Hgb 10.7 WBC 15.7 Platelets 869 Respiratory panel negative for COVID and influenza.  CXR with small bilateral pleural effusion   In ER, the patient started on NS and plan to give metoprolol 2.5mg  IV. BP 115/68  Past Medical History:  Diagnosis Date  . Anemia   . Arthritis   . Essential hypertension   . GERD (gastroesophageal reflux disease)   . Glaucoma   . PSVT (paroxysmal supraventricular tachycardia) ([place])     Past Surgical History:  Procedure Laterality Date  . BREAST BIOPSY     x2  . COLONOSCOPY N/A 12/19/2012   Procedure: COLONOSCOPY;  Surgeon: Kathy Houston, MD;  [facility];  Service: Endoscopy;  Laterality: N/A;  1030-moved to 1225 Ann to notify pt  . ESOPHAGOGASTRODUODENOSCOPY  04/23/2012   Procedure: ESOPHAGOGASTRODUODENOSCOPY (EGD);  Surgeon: Kathy Houston, MD;  [facility];  Service: Endoscopy;  Laterality: N/A;  1200  . INGUINAL HERNIA REPAIR  1998  . INGUINAL HERNIA REPAIR Left 02/19/2014   Procedure: HERNIA REPAIR INGUINAL ADULT;  Surgeon: Kathy So, MD;  Location: [facility];  Service: General;  Laterality: Left;  . INSERTION OF MESH Left 02/19/2014   Procedure: INSERTION OF MESH;  Surgeon: Kathy So, MD;  Location: [facility];  Service: General;  Laterality: Left;  . JOINT REPLACEMENT Bilateral   . LAPAROSCOPIC CHOLECYSTECTOMY    . TOTAL KNEE ARTHROPLASTY Bilateral    x 2 (both) one in 2010 and 2011  Inpatient Medications: Scheduled Meds:  Continuous Infusions:  PRN Meds:   Allergies:    Allergies  Allergen Reactions  . Bee Venom Anaphylaxis and Swelling  . Caine-1 [Lidocaine] Other (See Comments)    novocaine-passed out; due to epinephrine added to novocaine.    Social History:   Social History   Socioeconomic History  . Marital status: Widowed    Spouse name: Not on file  . Number of children: Not on file  . Years of education:  Not on file  . Highest education level: Not on file  Occupational History  . Occupation: Retired - [employer] Use  . Smoking status: Never Smoker  . Smokeless tobacco: Never Used  Vaping Use  . Vaping Use: Never used  Substance and Sexual Activity  . Alcohol use: No  . Drug use: No  . Sexual activity: Not Currently  Other Topics Concern  . Not on file  Social History Narrative   Married   Gets regular exercise   Social Determinants of Health   Financial Resource Strain: Not on file  Food Insecurity: Not on file  Transportation Needs: Not on file  Physical Activity: Not on file  Stress: Not on file  Social Connections: Not on file  Intimate Partner Violence: Not on file    Family History:   Family History  Problem Relation Age of Onset  . Colon polyps Sister   . Colon cancer Sister        alive, 51 years younger  . COPD Mother   . CAD Father   . Cancer Son        stomach - then spread to peritoneum  . Breast cancer Neg Hx   . Ovarian cancer Neg Hx   . Pancreatic cancer Neg Hx   . Prostate cancer Neg Hx   . Endometrial cancer Neg Hx      ROS:  Please see the history of present illness.  All other ROS reviewed and negative.     Physical Exam/Data:   Vitals:   09/29/20 1430 09/29/20 1500 09/29/20 1530 09/29/20 1600  BP: 128/80 119/80 110/69 115/68  Pulse: (!) 119 88 (!) 137 (!) 116  Resp: (!) 28 (!) 30 (!) 32 (!) 29  Temp:      TempSrc:      SpO2: 96% 95% 95% 95%   No intake or output data in the 24 hours ending 09/29/20 1608 Last 3 Weights 09/29/2020 09/26/2020 09/11/2020  Weight (lbs) 103 lb 105 lb 12.8 oz 122 lb  Weight (kg) 46.72 kg 47.991 kg 55.339 kg     There is no height or weight on file to calculate BMI.  General:  In no acute distress, cachectic appearing HEENT: normal Lymph: no adenopathy Neck: no JVD Endocrine:  No thryomegaly Vascular: No carotid bruits; FA pulses 2+ bilaterally without bruits  Cardiac:   normal S1, S2; iRRR; 2/6 systolic murmur  Lungs:  clear to auscultation bilaterally, no wheezing, rhonchi or rales  Abd: soft, nontender, no hepatomegaly  Ext: no edema Musculoskeletal:  No deformities, BUE and BLE strength normal and equal Skin: warm and dry  Neuro:  CNs 2-12 intact, no focal abnormalities noted Psych:  Normal affect   EKG:  The EKG was personally reviewed and demonstrates: Multifocal atrial tachycardia with P waves of at least 3 morphologies with ventricular rates in 140s.  Prior EKG from September 12, 2020 shows normal sinus rhythm. Telemetry:  Telemetry was personally reviewed and demonstrates: Multifocal  atrial tachycardia with ventricular rates ranging from 90s to low 100s  Relevant CV Studies: As above   Laboratory Data:  High Sensitivity Troponin:   Recent Labs  Lab 09/29/20 1520  TROPONINIHS 9     Chemistry Recent Labs  Lab 09/29/20 1520  NA 133*  K 3.6  CL 96*  CO2 24  GLUCOSE 133*  BUN 21  CREATININE 0.52  CALCIUM 8.5*  GFRNONAA >60  ANIONGAP 13    Recent Labs  Lab 09/29/20 1520  PROT 7.1  ALBUMIN 2.0*  AST 70*  ALT 61*  ALKPHOS 96  BILITOT 0.4   Hematology Recent Labs  Lab 09/29/20 1520  WBC 15.7*  RBC 3.76*  HGB 10.7*  HCT 34.7*  MCV 92.3  MCH 28.5  MCHC 30.8  RDW 14.7  PLT 869*   Radiology/Studies:  DG Chest Portable 1 View  Result Date: 09/29/2020 CLINICAL DATA:  New onset atrial fibrillation. EXAM: PORTABLE CHEST 1 VIEW COMPARISON:  September 11, 2020 FINDINGS: The heart size is mildly enlarged but stable from prior study. Aortic calcifications are noted. There is no pneumothorax. There may be small bilateral pleural effusions, left greater than right. This appears stable since prior study. There is an old healed left humeral fracture. No definite acute displaced fracture identified on today's study. IMPRESSION: 1. Stable cardiac silhouette. Small bilateral pleural effusions, left greater than right. 2. No acute  cardiopulmonary process. Electronically Signed   By: Constance Holster M.D.   On: 09/29/2020 15:47     Assessment and Plan:   1. MAT -multifocal atrial tachycardia, now with improved ventricular rates from 90 to low 100s, she is advised to hydrate well, continue Toprol-XL 25 mg p.o. twice daily add flecainide 50 mg p.o. twice daily, this was discussed with EP Dr. Quentin Ore -We will arrange follow-up in our clinic in the next 2-week and we will plan to perform Lexiscan nuclear stress test as she is unable to walk on a treadmill. -She is encouraged to continue taking potassium -She was previously noted to have paroxysmal atrial tachycardia on monitor in September 2021 and started on Toprol XL  2. uterine cancer - Per oncology -We will arrange for echocardiogram with strain given the fact she is going to be started on chemotherapy  For questions or updates, please contact [place] Please consult [email or web address] for contact info under    Jarrett Soho, PA  09/29/2020 4:08 PM

## 2020-09-29 NOTE — Assessment & Plan Note (Signed)
She has profound malignant cachexia and did not tolerate mirtazapine I recommend a trial of low-dose steroids We discussed the importance of frequent small meals She might benefit from dietitian review for high calorie intake

## 2020-09-29 NOTE — Discharge Instructions (Addendum)
If you develop fever, vomiting, dizziness/lightheadedness, trouble breathing, chest pain, palpitations, or any other new/concerning symptoms then return to the ER for evaluation.

## 2020-09-29 NOTE — Assessment & Plan Note (Signed)
She has significant reactive thrombocytosis likely due to her untreated cancer and possible iron deficiency She will continue aspirin therapy for now

## 2020-10-03 ENCOUNTER — Telehealth: Payer: Self-pay | Admitting: Oncology

## 2020-10-03 ENCOUNTER — Other Ambulatory Visit: Payer: Self-pay | Admitting: Oncology

## 2020-10-03 NOTE — Telephone Encounter (Signed)
Called Aneta Mins and advised him of GYN Cancer Conference recommendations for cardiac workup followed by chemotherapy and consideration for surgery.  Advised him to call if he has not heard from cardiology in the next few days.  He verbalized understanding and agreement.

## 2020-10-03 NOTE — Progress Notes (Signed)
Gynecologic Oncology Multi-Disciplinary Disposition Conference Note  Date of the Conference: 10/03/2020  Patient Name: Kathy Ford  Referring Provider: Max Sane, NP Primary GYN Oncologist: Dr. Pricilla Holm  Stage/Disposition:  Poorly differentiated carcinoma of uterine origin. Disposition is for cardiac workup with stress test for newly diagnosed atrial fibrillation followed by neoadjuvant chemotherapy followed by consideration for surgery.   This Multidisciplinary conference took place involving physicians from Gynecologic Oncology, Medical Oncology, Radiation Oncology, Pathology, Radiology along with the Gynecologic Oncology Nurse Practitioner and RN.  Comprehensive assessment of the patient's malignancy, staging, need for surgery, chemotherapy, radiation therapy, and need for further testing were reviewed. Supportive measures, both inpatient and following discharge were also discussed. The recommended plan of care is documented. Greater than 35 minutes were spent correlating and coordinating this patient's care.

## 2020-10-04 LAB — SURGICAL PATHOLOGY

## 2020-10-05 ENCOUNTER — Telehealth: Payer: Self-pay | Admitting: Cardiology

## 2020-10-05 ENCOUNTER — Telehealth (HOSPITAL_COMMUNITY): Payer: Self-pay | Admitting: *Deleted

## 2020-10-05 ENCOUNTER — Telehealth: Payer: Self-pay | Admitting: Physician Assistant

## 2020-10-05 NOTE — Telephone Encounter (Signed)
Pre-cert Verification for the following procedure    LEXISCAN   DATE:  10/10/2020  LOCATION:[facility]  ORDERED BY RHONDA BARRETT

## 2020-10-05 NOTE — Telephone Encounter (Signed)
Routed to stress testing department team members to advise

## 2020-10-05 NOTE — Telephone Encounter (Signed)
Nicki with [facility] states the patient has not received a call to review instructions for stress test ordered by Theodore Demark. Per Nicki, patient's grandson would like to know if the patient needs to hold Flecainide. Please return call to patient's grandson, Norton Pastel, to advise. If additional questions, please contact Nicki at [facility].  Norton Pastel: [phone] [facility]: [phone]

## 2020-10-05 NOTE — Telephone Encounter (Signed)
Patient's grandson given detailed instructions per Myocardial Perfusion Study Information Sheet for the test on 10/10/20 at 10:00. Patient notified to arrive 15 minutes early and that it is imperative to arrive on time for appointment to keep from having the test rescheduled.  If you need to cancel or reschedule your appointment, please call the office within 24 hours of your appointment. . Patient's grandson verbalized understanding.Daneil Dolin

## 2020-10-06 ENCOUNTER — Telehealth: Payer: Self-pay | Admitting: Gynecologic Oncology

## 2020-10-06 ENCOUNTER — Telehealth: Payer: Self-pay

## 2020-10-06 NOTE — Telephone Encounter (Signed)
Told grandson Kathy Ford that giving medication for nausea can add to the constipation. Increasing the Miralax to bid may help with BM and decrease nausea. Kathy Ford really wants to discuss with provider weather she is really able to tolerate chemotherapy.  He does not fell she is up to the Myocardial perfusion test on 10-11-19.

## 2020-10-06 NOTE — Telephone Encounter (Signed)
Reviewed below with Dr.Tucker and Phillip's desire to discuss what is the best plan of care moving forward with Kathy Ford's decline. Dr. Pricilla Holm will call the grandson.

## 2020-10-06 NOTE — Telephone Encounter (Signed)
Spoke with patient's grandson about his concerns regarding Kathy Ford's fitness and ability to tolerate treatment. Even since I met her, she has continued to decline. She now needs help bathing and doing all ADLs. She is scheduled for a stress test on Monday. He is concerned that it will be very difficult on her to get her to the appointment. We reviewed criteria that patient's must meet to receive treatment. I agree that it sounds like her performance status and overall health would not make her a good candidate for treatment. Arnette Norris is going to speak with Rise Paganini later today. He will let me know if I can be of help in this conversation regarding goals of care and refocusing on comfort.  Jeral Pinch MD Gynecologic Oncology

## 2020-10-10 ENCOUNTER — Encounter (HOSPITAL_COMMUNITY): Payer: Medicare Other

## 2020-10-10 ENCOUNTER — Telehealth: Payer: Self-pay | Admitting: Oncology

## 2020-10-10 DIAGNOSIS — C55 Malignant neoplasm of uterus, part unspecified: Secondary | ICD-10-CM

## 2020-10-10 NOTE — Telephone Encounter (Signed)
Kathy Ford and he said they are focusing on comfort care right now for Kathy Ford.  He said she has gone down hill even more today and is having pain with bowel movements and is not able to get out of bed without assistance.  He has arranged 24 hour nursing care for her.  He would like [place] to call in a hospice referral to [facility].  Called [facility] and their intake nurse will call back.

## 2020-10-11 DIAGNOSIS — H409 Unspecified glaucoma: Secondary | ICD-10-CM | POA: Diagnosis not present

## 2020-10-11 DIAGNOSIS — C55 Malignant neoplasm of uterus, part unspecified: Secondary | ICD-10-CM | POA: Diagnosis not present

## 2020-10-11 DIAGNOSIS — M1990 Unspecified osteoarthritis, unspecified site: Secondary | ICD-10-CM | POA: Diagnosis not present

## 2020-10-11 DIAGNOSIS — R42 Dizziness and giddiness: Secondary | ICD-10-CM | POA: Diagnosis not present

## 2020-10-11 DIAGNOSIS — N3946 Mixed incontinence: Secondary | ICD-10-CM | POA: Diagnosis not present

## 2020-10-11 DIAGNOSIS — K219 Gastro-esophageal reflux disease without esophagitis: Secondary | ICD-10-CM | POA: Diagnosis not present

## 2020-10-11 DIAGNOSIS — I10 Essential (primary) hypertension: Secondary | ICD-10-CM | POA: Diagnosis not present

## 2020-10-11 DIAGNOSIS — R112 Nausea with vomiting, unspecified: Secondary | ICD-10-CM | POA: Diagnosis not present

## 2020-10-11 DIAGNOSIS — R531 Weakness: Secondary | ICD-10-CM | POA: Diagnosis not present

## 2020-10-11 NOTE — Telephone Encounter (Signed)
Cassandra from [facility] called back.  Advised her that Dr. Pricilla Holm will not be the attending.  She will reach out to [facility] PCP, Dr. Sherril Croon.  She will also contact Meriam Sprague and [place] today.

## 2020-10-13 DIAGNOSIS — I10 Essential (primary) hypertension: Secondary | ICD-10-CM | POA: Diagnosis not present

## 2020-10-13 DIAGNOSIS — C55 Malignant neoplasm of uterus, part unspecified: Secondary | ICD-10-CM | POA: Diagnosis not present

## 2020-10-13 DIAGNOSIS — H409 Unspecified glaucoma: Secondary | ICD-10-CM | POA: Diagnosis not present

## 2020-10-13 DIAGNOSIS — R531 Weakness: Secondary | ICD-10-CM | POA: Diagnosis not present

## 2020-10-13 DIAGNOSIS — M1990 Unspecified osteoarthritis, unspecified site: Secondary | ICD-10-CM | POA: Diagnosis not present

## 2020-10-13 DIAGNOSIS — K219 Gastro-esophageal reflux disease without esophagitis: Secondary | ICD-10-CM | POA: Diagnosis not present

## 2020-10-18 DIAGNOSIS — C55 Malignant neoplasm of uterus, part unspecified: Secondary | ICD-10-CM | POA: Diagnosis not present

## 2020-10-18 DIAGNOSIS — K219 Gastro-esophageal reflux disease without esophagitis: Secondary | ICD-10-CM | POA: Diagnosis not present

## 2020-10-18 DIAGNOSIS — R531 Weakness: Secondary | ICD-10-CM | POA: Diagnosis not present

## 2020-10-18 DIAGNOSIS — I10 Essential (primary) hypertension: Secondary | ICD-10-CM | POA: Diagnosis not present

## 2020-10-18 DIAGNOSIS — H409 Unspecified glaucoma: Secondary | ICD-10-CM | POA: Diagnosis not present

## 2020-10-18 DIAGNOSIS — M1990 Unspecified osteoarthritis, unspecified site: Secondary | ICD-10-CM | POA: Diagnosis not present

## 2020-10-20 ENCOUNTER — Other Ambulatory Visit: Payer: Medicare Other

## 2020-10-21 DIAGNOSIS — H409 Unspecified glaucoma: Secondary | ICD-10-CM | POA: Diagnosis not present

## 2020-10-21 DIAGNOSIS — C55 Malignant neoplasm of uterus, part unspecified: Secondary | ICD-10-CM | POA: Diagnosis not present

## 2020-10-21 DIAGNOSIS — M1990 Unspecified osteoarthritis, unspecified site: Secondary | ICD-10-CM | POA: Diagnosis not present

## 2020-10-21 DIAGNOSIS — R531 Weakness: Secondary | ICD-10-CM | POA: Diagnosis not present

## 2020-10-21 DIAGNOSIS — I10 Essential (primary) hypertension: Secondary | ICD-10-CM | POA: Diagnosis not present

## 2020-10-21 DIAGNOSIS — K219 Gastro-esophageal reflux disease without esophagitis: Secondary | ICD-10-CM | POA: Diagnosis not present

## 2020-10-22 DIAGNOSIS — K219 Gastro-esophageal reflux disease without esophagitis: Secondary | ICD-10-CM | POA: Diagnosis not present

## 2020-10-22 DIAGNOSIS — C55 Malignant neoplasm of uterus, part unspecified: Secondary | ICD-10-CM | POA: Diagnosis not present

## 2020-10-22 DIAGNOSIS — H409 Unspecified glaucoma: Secondary | ICD-10-CM | POA: Diagnosis not present

## 2020-10-22 DIAGNOSIS — I10 Essential (primary) hypertension: Secondary | ICD-10-CM | POA: Diagnosis not present

## 2020-10-22 DIAGNOSIS — R531 Weakness: Secondary | ICD-10-CM | POA: Diagnosis not present

## 2020-10-22 DIAGNOSIS — M1990 Unspecified osteoarthritis, unspecified site: Secondary | ICD-10-CM | POA: Diagnosis not present

## 2020-10-23 DIAGNOSIS — R404 Transient alteration of awareness: Secondary | ICD-10-CM | POA: Diagnosis not present

## 2020-10-25 ENCOUNTER — Other Ambulatory Visit (HOSPITAL_COMMUNITY): Payer: Medicare Other

## 2020-11-08 DIAGNOSIS — 419620001 Death: Secondary | SNOMED CT | POA: Diagnosis not present

## 2020-11-08 DEATH — deceased

## 2020-12-13 ENCOUNTER — Ambulatory Visit: Payer: Medicare Other | Admitting: Cardiology

## 2020-12-16 ENCOUNTER — Ambulatory Visit: Payer: Medicare Other | Admitting: Cardiology

## 2021-12-05 IMAGING — DX DG CHEST 1V PORT
1 series · 1 of 1 positions shown · non-contrast
Comparison: September 11, 2020

CLINICAL DATA: New onset atrial fibrillation.

EXAM:
PORTABLE CHEST 1 VIEW

[chest ap]
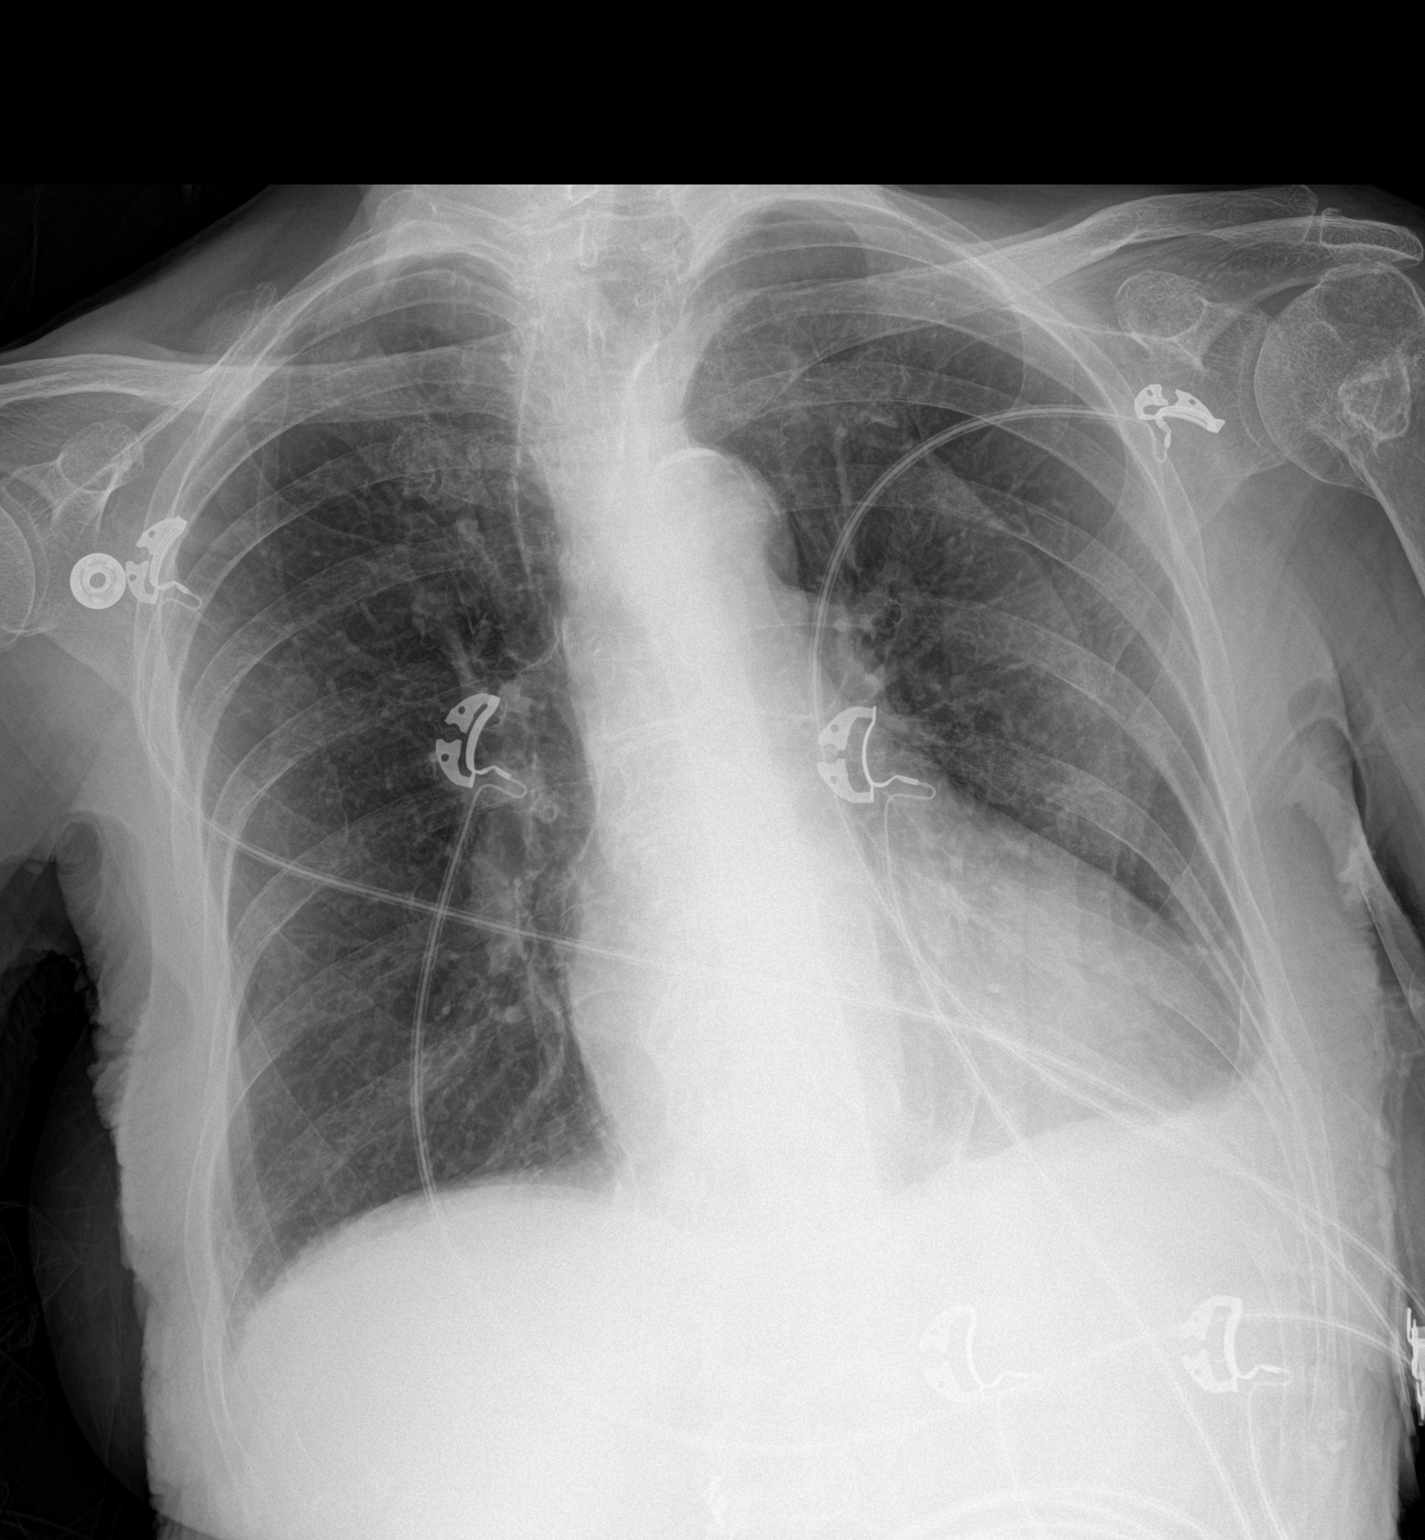

[1 of 1 positions shown; findings below may reference images not displayed]

FINDINGS: The heart size is mildly enlarged but stable from prior study.
Aortic calcifications are noted. There is no pneumothorax. There may
be small bilateral pleural effusions, left greater than right. This
appears stable since prior study. There is an old healed left
humeral fracture. No definite acute displaced fracture identified on
today's study.
IMPRESSION: 1. Stable cardiac silhouette. Small bilateral pleural effusions,
left greater than right.
2. No acute cardiopulmonary process.
